# Patient Record
Sex: Female | Born: 1955 | Race: White | Hispanic: No | Marital: Married | Smoking: Never smoker
Health system: Southern US, Community
[De-identification: ages and names within clinical notes are randomized; demographics above are authoritative.]

## PROBLEM LIST (undated history)

## (undated) DIAGNOSIS — F419 Anxiety disorder, unspecified: Secondary | ICD-10-CM

## (undated) DIAGNOSIS — Z803 Family history of malignant neoplasm of breast: Secondary | ICD-10-CM

## (undated) DIAGNOSIS — Z8041 Family history of malignant neoplasm of ovary: Secondary | ICD-10-CM

## (undated) HISTORY — DX: Family history of malignant neoplasm of ovary: Z80.41

## (undated) HISTORY — PX: BACK SURGERY: SHX140

## (undated) HISTORY — DX: Anxiety disorder, unspecified: F41.9

## (undated) HISTORY — DX: Family history of malignant neoplasm of breast: Z80.3

---

## 1992-05-06 HISTORY — PX: VAGINAL HYSTERECTOMY: SUR661

## 1992-05-06 HISTORY — PX: CHOLECYSTECTOMY: SHX55

## 1993-05-06 HISTORY — PX: APPENDECTOMY: SHX54

## 2002-06-30 ENCOUNTER — Ambulatory Visit (HOSPITAL_COMMUNITY): Admission: RE | Admit: 2002-06-30 | Discharge: 2002-06-30 | Payer: Self-pay | Admitting: Internal Medicine

## 2002-06-30 ENCOUNTER — Encounter: Payer: Self-pay | Admitting: Internal Medicine

## 2002-09-12 HISTORY — PX: SPINE SURGERY: SHX786

## 2002-12-01 ENCOUNTER — Encounter (INDEPENDENT_AMBULATORY_CARE_PROVIDER_SITE_OTHER): Payer: Self-pay | Admitting: Specialist

## 2002-12-01 ENCOUNTER — Observation Stay (HOSPITAL_COMMUNITY): Admission: RE | Admit: 2002-12-01 | Discharge: 2002-12-02 | Payer: Self-pay | Admitting: Specialist

## 2002-12-02 ENCOUNTER — Encounter: Payer: Self-pay | Admitting: Specialist

## 2003-10-05 ENCOUNTER — Inpatient Hospital Stay (HOSPITAL_COMMUNITY): Admission: RE | Admit: 2003-10-05 | Discharge: 2003-10-08 | Payer: Self-pay | Admitting: Specialist

## 2004-12-18 ENCOUNTER — Encounter: Admission: RE | Admit: 2004-12-18 | Discharge: 2004-12-18 | Payer: Self-pay | Admitting: Gynecology

## 2005-01-21 ENCOUNTER — Other Ambulatory Visit: Admission: RE | Admit: 2005-01-21 | Discharge: 2005-01-21 | Payer: Self-pay | Admitting: Gynecology

## 2007-03-20 ENCOUNTER — Encounter: Admission: RE | Admit: 2007-03-20 | Discharge: 2007-03-20 | Payer: Self-pay | Admitting: Orthopedic Surgery

## 2007-05-13 ENCOUNTER — Inpatient Hospital Stay (HOSPITAL_COMMUNITY): Admission: RE | Admit: 2007-05-13 | Discharge: 2007-05-15 | Payer: Self-pay | Admitting: Specialist

## 2007-05-13 ENCOUNTER — Ambulatory Visit: Payer: Self-pay | Admitting: *Deleted

## 2007-09-08 ENCOUNTER — Encounter: Admission: RE | Admit: 2007-09-08 | Discharge: 2007-09-08 | Payer: Self-pay | Admitting: Gynecology

## 2009-03-08 ENCOUNTER — Encounter: Admission: RE | Admit: 2009-03-08 | Discharge: 2009-03-08 | Payer: Self-pay | Admitting: Gynecology

## 2010-03-06 LAB — HM COLONOSCOPY

## 2010-03-12 ENCOUNTER — Encounter: Admission: RE | Admit: 2010-03-12 | Discharge: 2010-03-12 | Payer: Self-pay | Admitting: Gynecology

## 2010-03-19 LAB — HM DEXA SCAN: HM Dexa Scan: NORMAL

## 2010-09-18 NOTE — Op Note (Signed)
NAMECECYLIA, BRAZILL NO.:  [id number]   MEDICAL RECORD NO.:  [id number]          PATIENT TYPE:  INP   LOCATION:  2550                         FACILITY:  [facility]   PHYSICIAN:  Jene Every, M.D.    DATE OF BIRTH:  12/04/1955   DATE OF PROCEDURE:  05/13/2007  DATE OF DISCHARGE:                               OPERATIVE REPORT   PREOPERATIVE DIAGNOSES:  Degenerative disc disease at L5-S1.   POSTOPERATIVE DIAGNOSES:  Degenerative disc disease at L5-S1.   PROCEDURES PERFORMED:  1. Anterior discectomy at L5-S1.  2. Anterior lumbar interbody fusion, utilizing a Synthes Synex      interbody cage.  3. Interbody fusion, utilizing Actifuse bone graft.   ANESTHESIA:  General.   ASSISTANT:  Alvy Beal, M.D.  Approach by Dr. Madilyn Fireman.   BRIEF HISTORY AND INDICATIONS:  This 55 year old is status post fusion  at 4-5 and discogenic pain at 5-1, disabling, confirmed with  discography.  She was indicated for an anterior interbody discectomy and  fusion.  Risks and benefits were discussed, including bleeding,  infection, no change in symptoms, worsening symptoms, pseudarthrosis,  need for revision, DVT, TE, anesthetic complications, etc.   TECHNIQUE:  Patient in supine position with a lumbar roll, the abdomen  was prepped and draped in the usual sterile fashion.  After 2 g Kefzol,  the abdomen was prepped and draped in the usual sterile fashion.  Dr.  Madilyn Fireman then performed the anterior approach to the L5-S1 disc space  anteriorly and please refer to his operative dictation.  After the  incision was made through the skin in transverse fashion on the left,  after fluoro localization with a radiopaque marker, the transverse  fascia was divided.  We opened the perineum, developed a plane between  that and the retroperitoneal space.  Following that, the vascular  structures, ureters and presacral vessels were then gently mobilized by  Dr. Madilyn Fireman.  A self-retaining Balfour  retractor was placed, exposing  quite nicely the anterior aspect of the L5-S1 disc space.  This was  confirmed by x-ray in the appropriate plane.   We used a trial spacer and used a #10 blade, incising along the end  plates of 5 and S1 and then, out as far laterally as the exposure.  The  disc was then removed with a Leksell rongeur and pituitaries.  We used a  small Cobb to detach the anterior disc from the vertebral end plate,  further aiding in the discectomy.  We performed sequential curetting of  the end plate from anterior to posterior, removing the disc that was  removed, preserving the end plates.  We used a contralateral distractor  and C-arm to judge our posterior distance.  Following the full  discectomy, anterolateral and mediolateral, we identified the uncinate  process of the vertebral body of 5 and the insertion of the annulus with  a small curved curet.  This annulus was then detached with the small  curved portion of the curet, placing over the top and behind the  uncinate process.  This was performed across the entire posterior  and  posterolateral aspect of the disc space to free the annulus.  Following  this, the disc space was copiously irrigated.   On exam of the end plates, there was good bleeding bone noted on the L5  and S1 vertebral bodies.  The vascular structures were well-protected.  Under fluoroscopy, we utilized a distractor and real-time C-arm,  distracted the 5-1 disc space and they distracted parallel, as opposed  to divergent, indicating that the detachment of the posterior annulus.  I then utilized trial spacers and we felt that the medium 12 degree  lordotic Synex device was the optimal device.  This was then selected  and packed with Actifuse bone graft.  The device was then placed in the  inserter, which is called the Squid, and under x-ray, we placed it on  the midline.  This was confirmed by sighting from the end of the table  to be midline and  vertical.  This was then impacted into place.  The  initial large insert was tried and this was unacceptable.  Following  impaction in the AP and lateral plane, it was found to be satisfactory  with the anterior portion and flush with the anterior portion of 5.  The  Squid was removed, the implant checked and was fixed well in the AP and  lateral plane.  The wound was irrigated.   Vessels were examined throughout the case.  We then placed the channel  guide for the screws and the awl on the anterior aspect of the disc.  The 5 and 1 on the right and then 5 and 1 on the left were placed after  broaching the cortex with an awl and insertion of 20 mm screws in 5 and  in 1 with excellent purchase and locking to the locking plate.  The  guide was then removed and inspected.  The implant was secured and was  flush with the anterior aspect of 5.  In the AP and lateral plane, it  was found to seat satisfactorily.  There was no involvement with the  pedicle screws from above.  This was felt to be an excellent placement  of the prosthesis.   The wound was copiously irrigated.  We inspected.  There was no evidence  of active bleeding.  We removed the Balfour retractors, copiously  irrigated the abdominal wound.  Dr. Madilyn Fireman then returned for closure.  He  closed the rectus sheath, the subcutaneous tissue and the skin and a  sterile dressing was applied.  She was then extubated without difficulty  and transported to the recovery room in satisfactory condition.   The patient tolerated the procedure well, no complications.   Approximate blood loss was 150 mL.   She had a pulse oximeter on the left leg throughout the case, which  demonstrated 100% saturation throughout the case.  In the recovery room,  patient had good pulses distally and good blood flow.   The patient tolerated the procedure well, no complications.      Jene Every, M.D.  Electronically Signed     JB/MEDQ  D:  05/13/2007   T:  05/13/2007  Job:  [id number]

## 2010-09-18 NOTE — Op Note (Signed)
NAMEASUZENA, WEIS NO.:  [id number]   MEDICAL RECORD NO.:  [id number]          PATIENT TYPE:  INP   LOCATION:  5039                         FACILITY:  [facility]   PHYSICIAN:  Balinda Quails, M.D.    DATE OF BIRTH:  01-22-1956   DATE OF PROCEDURE:  05/13/2007  DATE OF DISCHARGE:                               OPERATIVE REPORT   SURGEON:  Denman George, MD.   CO-SURGEON:  Jene Every, MD.   ASSISTANT:  Dahari D. Shon Baton, MD.   ANESTHETIC:  General endotracheal.   PREOPERATIVE DIAGNOSIS:  L5-S1 degenerative disk disease.   POSTOPERATIVE DIAGNOSIS:  L5-S1 degenerative disk disease.   PROCEDURE:  Left retroperitoneal anterior exposure L5-S1 for L5-S1 ALIF.   CLINICAL NOTE:  This is a 55 year old female with chronic back pain and  evidence of degenerative disk disease at L5-S1.  She was seen by Dr.  Shelle Iron and scheduled to undergo anterior lumbar interbody fusion at L5-  S1.  Patient seen preoperatively and the risks and benefits of the  operative procedure were explained in detail.  Potential risks of the  anterior retroperitoneal approach discussed included but not limited to  bleeding, infection, transfusion, limb ischemia, ureter injury, hernia,  DVT, pulmonary embolus and death.   OPERATIVE PROCEDURE:  The patient brought the operating room in stable  condition.  Placed in supine position.  General endotracheal anesthesia  induced.  Foley catheter, arterial line in place.  Central venous  catheter in place.  Pulse oximetry placed on the left foot.   The abdomen prepped and draped in a sterile fashion.  Transverse skin  incision made in the left lower quadrant from midline to lateral margin  of left rectus muscle.  Dissection carried down through the subcutaneous  tissue with electrocautery.  Left anterior rectus sheath incised from  midline to lateral margin.  The rectus muscle mobilized.  The rectum the  left rectus muscle retracted medially.  The  retroperitoneal space  entered.  The peritoneal contents rotated anteriorly.  The peritoneum  bluntly pushed off of the poster rectus sheath which was incised  longitudinally.  The psoas muscle and genitofemoral nerves were  identified and preserved.  The soft tissues were then pushed off of the  left common and external iliac artery.  The ureter was retracted  medially with the abdominal contents.  The L5-S1 disk was palpated.  This was cleared of presacral tissues.  The fascial plane overlying the  L5-S1 disk was incised.  The presacral tissues were then bluntly pushed  off the L5-S1 disk from left-to-right.  Middle sacral vessel was clipped  and divided.  The L5-S1 disk fully exposed.   The Thompson-Brau retractor was then assembled.  Using reverse lip Brau  blades the L5-S1 disk was exposed, placing the reverse lip blades on the  lateral margins of the L5 and S1 bodies bilaterally.  Malleable  retractors were used to retract superiorly and inferiorly.   Dr. Shelle Iron with the assistance of Dr. Shon Baton then completed an L5-S1  ALIF.   At completion of the ALIF, retractors were removed.  There was no  significant bleeding.  No apparent complications.  Ureter replaced in  its anatomical position.  Sponge and instrument counts correct.   The anterior rectus sheath then closed with running 0 Vicryl suture.  Subcutaneous tissue closed with running 2-0 Vicryl suture.  Superficial  subcutaneous layer closed running 2-0 Vicryl suture.  Skin closed with 4-  0 Monocryl.  Dermabond applied.  Sterile dressing applied.   The patient tolerated procedure well.  No apparent complications.  Transferred to the recovery room in stable condition.      Balinda Quails, M.D.  Electronically Signed     PGH/MEDQ  D:  05/15/2007  T:  05/15/2007  Job:  [id number]

## 2010-09-21 NOTE — H&P (Signed)
NAME:  Kelli, Gould                         ACCOUNT NO.:  [id number]   MEDICAL RECORD NO.:  [id number]                   PATIENT TYPE:  INP   LOCATION:  NA                                   FACILITY:  [facility]   PHYSICIAN:  Jene Every, M.D.                 DATE OF BIRTH:  01-16-1956   DATE OF ADMISSION:  10/05/2003  DATE OF DISCHARGE:                                HISTORY & PHYSICAL   CHIEF COMPLAINT:  Left lower extremity pain.   HISTORY:  Kelli Gould is a 55 year old female who initially presented to [place]  in June of 2004 with complaints of low back and lower extremity pain.  The  patient was initially treated conservatively, however, continued to be  symptomatic.  MRI was obtained which did show degenerative disk disease, L4-  5 level, with disk bulging.  Patient underwent lateral recess decompression,  microdiskectomy at the L4-5 level.  She did well for some time, however,  then noted reoccurrence of her symptoms.  Re-imaging was done in April of  2005, which just showed recurrent foraminal disk protrusion on the left,  facing the L4 nerve root, as well as enhancing epidural fibrosis around the  thecal sac, around the L5 nerve root.  Due to the fact the patient has  failed to progress with the conservative and the evidence of recurrent disk  herniation as well as progression of her degenerative disk disease, it is  felt she will benefit from a decompression along with a TLIF and  posterolateral fusion.  The risks and benefits of the surgery were discussed  with the patient and she wishes to proceed.   MEDICAL HISTORY:  Noncontributory.   CURRENT MEDICATIONS:  Current medications include:  1. Celebrex 1 p.o. daily.  2. Vicodin p.r.n.  3. Nortriptyline 10 mg 1 p.o. daily.   ALLERGIES:  None.   PREVIOUS SURGERY:  Previous surgeries include:  1. Cholecystectomy.  2. Hysterectomy.  3. Decompression in 2004.   SOCIAL HISTORY:  The patient is married.  She does not smoke  or drink  alcohol.  She has 2 children, lives in a 1-story home.  Husband will be care-  giver following the surgery.   FAMILY HISTORY:  Mother has a history of hypertension.   REVIEW OF SYSTEMS:  GENERAL:  The patient denies any fever, chills, night  sweats or bleeding tendencies.  CNS:  No blurred or double vision, seizure,  headache or paralysis.  RESPIRATORY:  No shortness of breath, productive  cough or hemoptysis.  CARDIOVASCULAR:  No chest pain, angina or orthopnea.  GU:  No dysuria, hematuria or discharge.  GI:  No nausea, vomiting,  diarrhea, constipation, melena or bloody stools.  MUSCULOSKELETAL:  Musculoskeletal as pertinent in HPI.   PHYSICAL EXAM:  VITAL SIGNS:  On physical exam, pulse is 100, respiratory  rate is 16, BP 160/110.  GENERAL:  This is a well-developed,  well-nourished 55 year old female in  mild distress.  HEENT:  Atraumatic, normocephalic.  Pupils equal, round and reactive to  light.  EOMs intact.  NECK:  Neck supple with no lymphadenopathy.  CHEST:  Chest clear to auscultation bilaterally.  No rhonchi, wheezes or  rales.  BREASTS AND GU:  Not examined, not pertinent to HPI.  HEART:  Tachycardia is noted, regular rhythm; no murmurs, gallops or rubs  are noted.  ABDOMEN:  Abdomen is soft, nontender and nondistended.  Bowel sounds x4.  EXTREMITIES:  The patient has a positive straight leg raise on the left that  produces low back and buttock pain.  EHL is 5-/5.  BACK:  She does have significant pain with range of motion of the lumbar  spine.   IMPRESSION:  Progressive degenerative disk disease, L4-5, with recurrent  herniated nucleus pulposus.   PLAN:  Decompression at the L4-5 level with TLIF, posterolateral fusion with  instrumentation using autologous allograft bone.      Kelli Gould, P.A.                   Jene Every, M.D.    CS/MEDQ  D:  09/22/2003  T:  09/23/2003  Job:  [id number]

## 2010-09-21 NOTE — Op Note (Signed)
NAME:  Kelli Gould, Kelli Gould                         ACCOUNT NO.:  [id number]   MEDICAL RECORD NO.:  [id number]                   PATIENT TYPE:  INP   LOCATION:  2550                                 FACILITY:  [facility]   PHYSICIAN:  Jene Every, M.D.                 DATE OF BIRTH:  Oct 03, 1955   DATE OF PROCEDURE:  10/05/2003  DATE OF DISCHARGE:                                 OPERATIVE REPORT   PREOPERATIVE DIAGNOSIS:  Recurrent disk herniation, degenerative disk  disease, L4-5.   POSTOPERATIVE DIAGNOSIS:  Recurrent disk herniation, degenerative disk  disease, L4-5.   OPERATION PERFORMED:  1. Redo decompression, L4-5, foraminotomies of L4 and L5.  2. Transforaminal interbody fusion at L4-5 utilizing a [employer] PEAK     interbody device with autologous and allograft bone graft utilizing the     Symphony bone graft.  3. Pathfinder pedicle screw instrumentation, L4-5.  4. Posterolateral fusion utilizing autologous and symphony bone graft.  5. Intraoperative continuous neural monitoring four hours with EMG pedicle     screw testing.   SURGEON:  Jene Every, M.D.   ASSISTANT:  1. Kerrin Champagne, M.D.  2. Roma Schanz, P.A.   ANESTHESIA:  General.   INDICATIONS FOR PROCEDURE:  The patient is a 55 year old with recurrent disk  herniation with progressive disk degeneration, disabling back pain, left leg  pain.  Operative intervention was indicated for redo decompression,  interbody distraction for the L4 and L5 foramina and fusion.  Risks and  benefits discussed including bleeding, infection __________  CSF leakage,  epidural fibrosis, adjacent segment disease, need for fusion in the future  in the adjacent segments, hardware failure, etc.   DESCRIPTION OF PROCEDURE:  The patient was placed in supine position after  adequate induction of general anesthesia, 1 g Kefzol, she was placed prone  on the Wilson frame in flexion.  All bony prominences were well padded.  The  lumbar  region was prepped and draped in the usual sterile fashion.  Incision  was made from the spinous process of 3 to S1.  Subcutaneous tissue was  dissected with electrocautery utilized to achieve hemostasis.  Dorsolumbar  fascia was identified and divided in line with the skin incision.  Kocher  was placed on the spinous process of 4.  This was confirmed with a C-arm.  Next, elevated the paraspinous musculature from lamina of 4 and 5  bilaterally.  Then removed portion of the spinous process of 4 and of 5  after skeletonizing it and removing the interspinous ligament, utilizing the  cancellous bone for future bone graft.  I then skeletonized the lamina of 4  and 5 using 2 and 3 mm Kerrison to enlarge the decompression of 4 and 5.  Following this, I used a high speed bur and an osteotome to remove the  inferior process of 4.  I then used an osteotome to remove the superior  process of 5.  I protected the 4 and the 5 roots at all times.  Then used a  2 and a 3 mm Kerrison to remove the residual facet exposing the pedicle at  5.  The 4 root was well protected in fat and ligamentum flavum.  Recurrent  disk herniation was noted.  I mobilized the 5 root, performed a foraminotomy  L5 and gently mobilized it medially.  The neural elements were well  protected throughout the case.  Intraoperative neural monitoring was  utilized.  Annulotomy was performed at 4-5 and a copious portion of disk  material was removed from the disk space.  The disk space was then curetted.  The end plates were curetted and a full diskectomy and skeletonization of  the end plates occurred and removed all soft tissue.  This was then  sequentially dilated to 11 mm with excellent purchase in the lateral plane  under x-ray and it was felt that a 10 interbody spacer would be optimal.  Then used a lamina spreader between the spinous processes to gain access to  the space.  With the neural elements well protected, electrocautery  then  utilized to achieve hemostasis.  I then utilized the 10 PEAK cage packed  with autologous bone graft from the facet and spinous processes.  I put the  autologous bone graft into the disk space in front of the cage, packed it  copiously.  I then inserted the cage and kicked it to the midline utilizing  the appropriate insertion devices with excellent distraction of the disk  space.  I then removed the lamina spreader.  In the AP and lateral plane it  was found to be satisfactory with good distraction noted. I then placed a  hockey stick probe in the foramina of 4 and 5, found to be widely patent.  I  utilized neural monitoring and tested the nerve roots of 4 and 5.  It showed  1 milliamp which was appropriate.  Bipolar electrocautery was utilized to  achieve strict hemostasis. Copiously irrigated with irrigation.  I then  placed Thrombin soaked Gelfoam over the defect.  Then we closed the central  fascia with towel clips, made separate fascial incision, paraspinous  musculature, insertion of the pedicle screws.  I used a Pathfinder technique  and used a Jamshidi needle and in the appropriate convergence in the AP  plane in the lateral aspect of the pedicle bisecting the transverse process,  I entered the pedicle of 4 and 5 in a similar fashion in the AP and lateral  plane utilizing the guidewire meticulously paying attention to the guidewire  placement avoiding any advancement and broaching of the cortex of the  vertebrae.  We tested these with the neural monitoring and they were found  to be satisfactory.  We then dilated, used an awl then a tap at both 4 and 5  on the left.  We left the guidewires in and then placed the screws on the  contralateral side.  In the AP and lateral plane they were found to be  satisfactory.  We placed a hockey stick probe, felt the pedicle at 4 and 5  and there was no broach in the pedicle noted.  In a similar fashion on the contralateral side, we entered  the pedicles with a guidewire and Jamshidi  needle followed by dilator, awl, tap.  On the contralateral side we inserted  6.5 40 mm screws with excellent purchase on the right and then on  the left,  we did the same.  In the AP and lateral plane they were found to be  satisfactory, converging in the appropriate version.  Excellent placement of  these occurred and excellent purchase was noted.  Placed a 14 mm curved rod  bilaterally with provisional tightening, compressed both sides, locked them  down and removed the end caps.  In the AP and lateral plane, there was  satisfactory excellent placement of the rods convergence.  Prior to final  tightening, we reversed the lordosis on the Wilson frame.  Wound copiously  irrigated and then inspected the decompression area.  Hockey stick probe  placed in the foramina of 4 and 5, found widely patent, no broaching of the  pedicle, no active bleeding.  Copiously irrigated.  Thrombin soaked Gelfoam  was placed in the lateral recess.  Next, on the right we decorticated the  transverse processes and utilized the Symphony bone graft into the lateral  recess for posterolateral fusion.  This was packed copiously.  Next, the  fascial incision was closed with #1 Vicryl figure-of-eight sutures.  Hemovac  was placed centrally and brought out through a lateral stab wound in the  skin.  There was no evidence of CSF leak or intractable bleeding.  Dorsolumbar fascia reapproximated with #1 Vicryl interrupted figure-of-eight  sutures.  Subcutaneous tissue reapproximated with #2 Vicryl simple sutures.  The skin was reapproximated with staples.  The wound was dressed sterilely  and she was placed supine on the hospital bed and extubated without  difficulty and transported to recovery room in satisfactory condition.  The  patient tolerated the procedure well.  No complications.  Blood loss 300 mL.  There was no significant neural activity noted on the continuous neural   monitoring for four hours.  All pedicle screws that were placed were shown  satisfactory by probe testing.                                               Jene Every, M.D.    [name]  D:  10/05/2003  T:  10/05/2003  Job:  [id number]

## 2010-09-21 NOTE — Discharge Summary (Signed)
NAME:  Kelli Gould, Kelli Gould                         ACCOUNT NO.:  [id number]   MEDICAL RECORD NO.:  [id number]                   PATIENT TYPE:  INP   LOCATION:  5024                                 FACILITY:  [facility]   PHYSICIAN:  Jene Every, M.D.                 DATE OF BIRTH:  July 04, 1955   DATE OF ADMISSION:  10/05/2003  DATE OF DISCHARGE:  10/08/2003                                 DISCHARGE SUMMARY   ADMISSION DIAGNOSES:  Progressive degenerative disk disease L4-5 with  recurrent disk herniation.   DISCHARGE DIAGNOSES:  1. Progressive degenerative disk disease L4-5 with recurrent disk     herniation.  2. Status post redo decompression L4-5 with PLIF posterolateral fusion with     instrumentation using allograft and autograft bone.   HISTORY:  Kelli Gould is a 55 year old female who presented with complaints  of low back and lower extremity pain in June 2004.  The patient was treated  conservatively initially.  MRI was obtained which showed degenerative disk  disease at L4-5 level with disk bulging.  The patient underwent  decompression and microdiskectomy at this level.  She did well for several  months.  However, she noted gradual reoccurrence of her symptoms.  Repeat  MRI done in April 2005 showed recurrent foraminal disk protrusion on the  left facing L4 nerve root as well as epidural fibrosis around the thecal  sack and around the L5 nerve root.  Due to the patient's failure to progress  with conservative treatment and the evidence of recurrent disk herniation  and progression of her degenerative disk disease, it was felt she would  benefit from a redo decompression with a PLIF and posterolateral fusion.  The risks and benefits of the surgery were discussed with the patient.  She  wished to proceed.   PROCEDURES:  The patient was taken to the OR on October 05, 2003, to undergo a  redo decompression at the L4-5 level, transforaminal interbody fusion L4-5,  posterolateral fusion  using pedicle screw instrumentation.  Surgeon: Jene Every, M.D.  Assistant: Roma Schanz, P.A.-C and Kerrin Champagne, M.D.  Anesthesia:  General.  Complications:  None.   CONSULTATIONS:  PT and OT.   LABORATORY AND X-RAY DATA:  Preoperative labs:  CBC showed a white blood  cell count of 11.6, hemoglobin 14.6, hematocrit 41.7.  Routine CBCs were  followed throughout the hospital course.  The patient's white blood cell  count did go up to 18.1; however, at the time of discharge, it had gone down  to 14.9.  Routine hemoglobin and hematocrit showed slight drop in hemoglobin  to 10.8, hematocrit 31.1.  At the time of discharge, hemoglobin was 11.1,  hematocrit 31.5.  Routine chemistries done preoperatively were within normal  range.  The patient did have a slight drop in her potassium level to 3.4.  Sodium 136, slight elevation in glucose to maximum of 125.  Blood type A  negative.   EKG shows normal sinus rhythm.   Do not see a preoperative chest x-ray.   HOSPITAL COURSE:  The patient was admitted and taken to the OR for the above-  stated procedure.  She was then transferred to the PACU and then to the  orthopedic floor for continued postoperative care.  Routine postoperative  orders were followed and included IV antibiotics.  One Hemovac drain was  placed at the time of surgery.  The patient was placed on PCA analgesics.  Foley was placed.  The patient did very well postoperatively.  She advanced  well with her therapy.   On postoperative day #1, her diet was slowly advanced.  She did have a  slight temperature on postoperative day #2 with a maximum of 101.2 with  pulse 106.  Dressing was clean and dry.  Incision was clean and dry without  evidence of infection.  Hemovac was discontinue.  Motor and neurovascular  functions remained intact.  IV was discontinued.  Diet was advanced.  Incentive spirometer was encouraged.   On postoperative day #3, the patient was doing well.   She was afebrile. She  requested to be discharged home.  At this point, it was felt she was stable  enough to be discharged to home with all home health needs met, PT, OT.   DISCHARGE MEDICATIONS:  1. Percocet 1 to 2 p.o. q.4-6h. as needed.  2. Colace 100 mg 1 p.o. b.i.d.  3. Robaxin 500 mg 1 p.o. q.8h. p.r.n. spasm.   ACTIVITY:  The patient is to walk as tolerated.  She should use her back  brace when ambulating, back precautions initially.  No bending, twisting,  lifting, or arching.   WOUND CARE:  She is to change her dressing daily.  Okay to shower.   FOLLOW UP:  She is to call the office for any increased fever, excess  drainage.  She is to make an appointment with Dr. Shelle Iron in approximately 10  to 14 days.   DIET:  As tolerated.   CONDITION ON DISCHARGE:  Stable.   FINAL DIAGNOSES:  Status post redo decompression at L4-5 with posterolateral  fusion and PLIF.      Roma Schanz, P.A.                   Jene Every, M.D.    CS/MEDQ  D:  11/11/2003  T:  11/12/2003  Job:  [id number]

## 2010-09-21 NOTE — Op Note (Signed)
NAME:  Kelli Gould, Kelli Gould                         ACCOUNT NO.:  [id number]   MEDICAL RECORD NO.:  [id number]                   PATIENT TYPE:  AMB   LOCATION:  DAY                                  FACILITY:  [facility]   PHYSICIAN:  Jene Every, M.D.                 DATE OF BIRTH:  [birth year]   DATE OF PROCEDURE:  12/01/2002  DATE OF DISCHARGE:                                 OPERATIVE REPORT   PREOPERATIVE DIAGNOSES:  1. Spinal stenosis.  2. Degenerative disk disease.  3. 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   POSTOPERATIVE DIAGNOSES:  1. Spinal stenosis.  2. Degenerative disk disease.  3. 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   PROCEDURE PERFORMED:  1. Lateral recess decompression, L4-5 left, prominent.  2. Foraminotomy L4 and L5.  3. Microdiskectomy, L4-5 left.   ANESTHESIA:  General.   SURGEON:  Javier Docker, M.D.   ASSISTANT:  Roma Schanz, PA   BRIEF HISTORY AND INDICATIONS:  This is a 55 year old, who developed  refractory left lower extremity radicular pain in the L5 nerve root  distribution.  MRI indicated a small pericentral disk protrusion at L4-5,  hypertrophied 4-5 facet and lateral recess stenosis.  There was an area of  foraminal narrowing at L4 as well and a small disk herniation.  The  patient's symptoms progressed, exclusively in the L5 nerve root  distribution.  She had temporary relief for an epidural steroid injection at  L4-5.  Due to her refractory symptoms at 4-5 with predominant leg pain as  opposed to back pain, she was indicated for decompression of the L5 nerve  root by microdiskectomy and lateral recess decompression.  Preoperatively,  her exam had worsened since previously evaluated.  She had a positive  straight leg raise at 30 degrees for radicular pain into the top of the  foot.  EHL weakness was 5-/5 an decreased sensation in the L5 dermatome.  The risks and benefits discussed including bleeding, infection, damage to  vascular  structures, CSF leakage, epidural fibrosis, worsening symptoms,  need for fusion in the future, etc.   TECHNIQUE:  The patient in supine position.  After the induction of adequate  general anesthesia and 1 g of Kefzol, she was placed prone on the [place]  frame.  All bony prominences well-padded.  Lumbar region is prepped and  draped in the usual sterile fashion.  Two 18 gauge spinal needles are  utilized to localize the L4-5 interspace and confirmed with x-ray.  An  incision was made from the spinous process of L4 to L5.  Subcutaneous tissue  was dissected.  Electrocautery was utilized to achieve hemostasis.  Initially, we used a new concept minimally invasive endoscopic retractor.  This, however, did not gain [place] adequate medial visualization to evaluate the  interlaminar space and to gain access to the foramen.  Therefore, we  replaced it with a McCullough retractor with excellent visualization.  Obtained multiple x-rays to confirm the 4-5 space.  She had a hypertrophied  facet on the left.  I performed the hemi-laminotomy of the caudad edge of L4  utilizing a 2 and a 3 mm Kerrison.  Detached the ligamentum flavum from the  cephalad edge of L5 utilizing a straight curette.  A neural patty was placed  beneath the ligamentum flavum to protect the neural elements.  The  ligamentum flavum was removed from the interlaminar space.  There was severe  lateral recess stenosis noted here, mainly secondary to bony hypertrophy.  We utilized therefore a high-speed bur to gain access laterally and then  performed a partial medial hemifacetectomy of just the medial one-third  which was further enlarged with a 2 and a 3 mm Kerrison.  The L5 root, which  had been found to be compressing the lateral recess, was gently mobilized  medially and a focal HNP with a small extrusion was noted at 4-5.  After  performing an L5 foraminotomy, I performed an annulotomy at L4-5 and  performed a diskectomy and removed  the extruded fragment.  I also mobilized  the disk herniation which extended also into the L4 foramen with a nerve  hook and removed it from underneath the facet.  Hockey-stick probe placed in  the foramen in L4 and L5 after the decompression found them to be widely  patent and undercut the 4-5 facet the 2 mm Kerrison as well.  There was an  extensive epidural venous plexus noted as well, and electrocautery was  utilized to achieve hemostasis and divide the plexus, mobilizing the nerve  root.  Where the nerve was tethered prior to the decompression, after the  decompression, she had a medial excursion of at least 1 cm without residual  compression.  The L5 nerve root was found to be erythematous and edematous.  We sent the disk for specimen.  Copiously irrigated the disk space with  antibiotic irrigation and the interlaminar space with antibiotic irrigation.  Bone wax was placed over the bone surface of the partial facetectomies.  No  evidence of active bleeding or CSF leakage.  Removed the retractor,  copiously irrigated with antibiotic irrigation again.  Dorsolumbar fascia  reapproximated with #1 Vicryl interrupted figure-of-eight sutures,  subcutaneous tissue reapproximated with 2-0 Vicryl simple sutures.  Skin was  reapproximated with 4-0 subcuticular Prolene.  Wound was reinforced with  Steri-Strips.  Sterile dressing was applied.  She was placed supine on the  hospital bed, extubated without difficulty, and transported to the recovery  room in satisfactory condition.   The patient tolerated the procedure well with no complications.                                               Jene Every, M.D.    [name]  D:  12/01/2002  T:  12/01/2002  Job:  [id number]

## 2010-09-21 NOTE — H&P (Signed)
NAMENIOMI, VALENT NO.:  [id number]   MEDICAL RECORD NO.:  [id number]                   PATIENT TYPE:   LOCATION:                                       FACILITY:   PHYSICIAN:  Jene Every, M.D.                 DATE OF BIRTH:   DATE OF ADMISSION:  12/01/2002  DATE OF DISCHARGE:                                HISTORY & PHYSICAL   CHIEF COMPLAINT:  Low back pain with radicular pain into the left lower  extremity.   HISTORY:  [name] is a 55 year old female who noted a month onset of  low back pain. The patient states that the pain began when she missed a step  back in March of 2004; however, has progressed from low back pain to pain  down the left buttocks into the left foot and great toe. The patient does  note weakness towards the end of the day, increased pain with sitting. The  patient was initially treated conservatively following an MRI that showed  disk degeneration at L4-5 with a disk protrusion on the left at L4-5 with  lateral recess stenosis. She was treated with ESI at L4-5; however, she  continued with significant pain. She got no relief from the [facility] or [facility].  On physical exam, straight leg raise produces left buttock pain. There is  some mild EHL weakness on the left as compared to the right. It is felt at  this point due to the fact the patient has failed conservative treatment  including ESI, analgesics as well as anti-inflammatories that she would  benefit from a lumbar decompression. The risks and benefits of this surgery  were discussed with the patient and she wishes to proceed.   PAST MEDICAL HISTORY:  Significant for migraines.   PAST SURGICAL HISTORY:  Cholecystectomy, hysterectomy.   CURRENT MEDICATIONS:  Current medications included p.r.n. Celebrex, Vicodin,  and Robaxin. No known drug allergies.   SOCIAL HISTORY:  The patient is married. She denies any tobacco or alcohol  intake. Her husband will be her  caregiver following surgery.   FAMILY HISTORY:  Mother has hypertension.   REVIEW OF SYMPTOMS:  GENERAL:  The patient denies any fever, chills, night  sweats or bleeding tendencies. CNS:  No blurred or double vision, seizure,  headache or paralysis. RESPIRATORY:  No shortness of breath, productive  cough or hemoptysis. CARDIOVASCULAR:  No chest pain, angina, orthopnea. GU:  No dysuria, hematuria or discharge. GI:  No nausea, vomiting, constipation,  melena or bloody stools. MUSCULOSKELETAL:  As pertinent to HPI.   PHYSICAL EXAMINATION:  VITAL SIGNS:  Pulses 88, respirations 18, blood  pressure 130/90.  GENERAL:  This is a well-developed, well-nourished, 55 year old female in  mild distress. She does walk with a slight antalgic gait.  HEENT:  Normocephalic, atraumatic. Pupils equal round and reactive to light.  EOMs intact.  NECK:  Supple, no  lymphadenopathy.  CHEST:  Clear to auscultation bilaterally. No rhonchi, wheezes or rales.  BREAST/GU:  Not examined not pertinent to HPI.  ABDOMEN:  Soft, nontender, nondistended, bowel sounds x4.  HEART:  Regular rate and rhythm without murmur, gallop or rub.  SKIN:  No rashes or lesions are noted.  EXTREMITIES:  The patient does have positive straight leg raise on left that  produces some mild buttock pain. She has some mild EHL weakness on the left  as compared to the right.   X-rays show disk degeneration at L4-5, with paracentral disk protrusion on  the left at L4-5 with lateral recess stenosis.   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   PLAN:  The patient will be admitted to [facility] to undergo a  decompression at L4-5 and microdiskectomy by Jene Every, M.D. on December 01, 2002.     Roma Schanz, P.A.                   Jene Every, M.D.    CS/MEDQ  D:  11/25/2002  T:  11/25/2002  Job:  [id number]

## 2011-01-23 LAB — CBC
HCT: 34.3 — ABNORMAL LOW
HCT: 41.4
Hemoglobin: 14
MCHC: 33.8
MCHC: 34.2
MCV: 95.4
Platelets: 305
Platelets: 335
Platelets: 356
RBC: 4.33
RDW: 13.1
WBC: 14.5 — ABNORMAL HIGH

## 2011-01-23 LAB — URINALYSIS, ROUTINE W REFLEX MICROSCOPIC
Glucose, UA: NEGATIVE
Hgb urine dipstick: NEGATIVE
Urobilinogen, UA: 0.2
pH: 5.5

## 2011-01-23 LAB — TYPE AND SCREEN
ABO/RH(D): O NEG
Antibody Screen: NEGATIVE

## 2011-01-23 LAB — POCT I-STAT 7, (LYTES, BLD GAS, ICA,H+H)
Bicarbonate: 23.6
Calcium, Ion: 1.17
HCT: 34 — ABNORMAL LOW
Hemoglobin: 11.6 — ABNORMAL LOW
O2 Saturation: 100
Sodium: 138
pCO2 arterial: 34 — ABNORMAL LOW
pO2, Arterial: 260 — ABNORMAL HIGH

## 2011-01-23 LAB — BASIC METABOLIC PANEL
BUN: 2 — ABNORMAL LOW
Chloride: 103
Creatinine, Ser: 0.6
GFR calc Af Amer: >60
GFR calc Af Amer: >60
GFR calc non Af Amer: >60
Glucose, Bld: 112 — ABNORMAL HIGH
Potassium: 4.1
Sodium: 136
Sodium: 138

## 2011-01-23 LAB — PROTIME-INR: Prothrombin Time: 12.4

## 2011-01-23 LAB — APTT: aPTT: 31

## 2011-03-04 ENCOUNTER — Other Ambulatory Visit: Payer: Self-pay | Admitting: Gynecology

## 2011-03-04 DIAGNOSIS — Z1231 Encounter for screening mammogram for malignant neoplasm of breast: Secondary | ICD-10-CM

## 2011-03-07 LAB — HM PAP SMEAR

## 2011-03-20 ENCOUNTER — Other Ambulatory Visit: Payer: Self-pay | Admitting: Gynecology

## 2011-03-20 ENCOUNTER — Ambulatory Visit
Admission: RE | Admit: 2011-03-20 | Discharge: 2011-03-20 | Disposition: A | Discharge status: Home / Self Care | Payer: Managed Care, Other (non HMO) | Source: Ambulatory Visit | Attending: Gynecology | Admitting: Gynecology

## 2011-03-20 DIAGNOSIS — Z1231 Encounter for screening mammogram for malignant neoplasm of breast: Secondary | ICD-10-CM

## 2011-07-18 ENCOUNTER — Ambulatory Visit (INDEPENDENT_AMBULATORY_CARE_PROVIDER_SITE_OTHER): Payer: Managed Care, Other (non HMO) | Admitting: Family Medicine

## 2011-07-18 ENCOUNTER — Encounter: Payer: Self-pay | Admitting: Family Medicine

## 2011-07-18 VITALS — BP 126/82 | HR 81 | Temp 97.9°F | Ht 63.5 in | Wt 207.6 lb

## 2011-07-18 DIAGNOSIS — Z0000 Encounter for general adult medical examination without abnormal findings: Secondary | ICD-10-CM

## 2011-07-18 DIAGNOSIS — G8929 Other chronic pain: Secondary | ICD-10-CM

## 2011-07-18 DIAGNOSIS — F419 Anxiety disorder, unspecified: Secondary | ICD-10-CM | POA: Insufficient documentation

## 2011-07-18 DIAGNOSIS — F411 Generalized anxiety disorder: Secondary | ICD-10-CM

## 2011-07-18 DIAGNOSIS — Z23 Encounter for immunization: Secondary | ICD-10-CM

## 2011-07-18 DIAGNOSIS — M549 Dorsalgia, unspecified: Secondary | ICD-10-CM

## 2011-07-18 LAB — LDL CHOLESTEROL, DIRECT: Direct LDL: 144.7 mg/dL

## 2011-07-18 LAB — CBC WITH DIFFERENTIAL/PLATELET
Basophils Relative: 0.4 % (ref 0.0–3.0)
Eosinophils Relative: 0.3 % (ref 0.0–5.0)
HCT: 41.4 % (ref 36.0–46.0)
Lymphs Abs: 1.9 10*3/uL (ref 0.7–4.0)
Monocytes Relative: 4.9 % (ref 3.0–12.0)
Neutrophils Relative %: 76.1 % (ref 43.0–77.0)
Platelets: 384 10*3/uL (ref 150.0–400.0)
RBC: 4.33 Mil/uL (ref 3.87–5.11)
WBC: 10.5 10*3/uL (ref 4.5–10.5)

## 2011-07-18 LAB — POCT URINALYSIS DIPSTICK
Bilirubin, UA: NEGATIVE
Glucose, UA: NEGATIVE
Leukocytes, UA: NEGATIVE
Nitrite, UA: NEGATIVE

## 2011-07-18 LAB — LIPID PANEL: Total CHOL/HDL Ratio: 5

## 2011-07-18 LAB — HEPATIC FUNCTION PANEL
ALT: 19 U/L (ref 0–35)
AST: 26 U/L (ref 0–37)
Bilirubin, Direct: 0 mg/dL (ref 0.0–0.3)
Total Bilirubin: 0.1 mg/dL — ABNORMAL LOW (ref 0.3–1.2)

## 2011-07-18 LAB — BASIC METABOLIC PANEL
BUN: 11 mg/dL (ref 6–23)
GFR: 87.89 mL/min (ref >60.00)
Potassium: 3.5 mEq/L (ref 3.5–5.1)

## 2011-07-18 LAB — TSH: TSH: 1 u[IU]/mL (ref 0.35–5.50)

## 2011-07-18 NOTE — Patient Instructions (Signed)
Preventive Care for Adults, Female A healthy lifestyle and preventive care can promote health and wellness. Preventive health guidelines for women include the following key practices.  A routine yearly physical is a good way to check with your caregiver about your health and preventive screening. It is a chance to share any concerns and updates on your health, and to receive a thorough exam.   Visit your dentist for a routine exam and preventive care every 6 months. Brush your teeth twice a day and floss once a day. Good oral hygiene prevents tooth decay and gum disease.   The frequency of eye exams is based on your age, health, family medical history, use of contact lenses, and other factors. Follow your caregiver's recommendations for frequency of eye exams.   Eat a healthy diet. Foods like vegetables, fruits, whole grains, low-fat dairy products, and lean protein foods contain the nutrients you need without too many calories. Decrease your intake of foods high in solid fats, added sugars, and salt. Eat the right amount of calories for you.Get information about a proper diet from your caregiver, if necessary.   Regular physical exercise is one of the most important things you can do for your health. Most adults should get at least 150 minutes of moderate-intensity exercise (any activity that increases your heart rate and causes you to sweat) each week. In addition, most adults need muscle-strengthening exercises on 2 or more days a week.   Maintain a healthy weight. The body mass index (BMI) is a screening tool to identify possible weight problems. It provides an estimate of body fat based on height and weight. Your caregiver can help determine your BMI, and can help you achieve or maintain a healthy weight.For adults 20 years and older:   A BMI below 18.5 is considered underweight.   A BMI of 18.5 to 24.9 is normal.   A BMI of 25 to 29.9 is considered overweight.   A BMI of 30 and above is  considered obese.   Maintain normal blood lipids and cholesterol levels by exercising and minimizing your intake of saturated fat. Eat a balanced diet with plenty of fruit and vegetables. Blood tests for lipids and cholesterol should begin at age 20 and be repeated every 5 years. If your lipid or cholesterol levels are high, you are over 50, or you are at high risk for heart disease, you may need your cholesterol levels checked more frequently.Ongoing high lipid and cholesterol levels should be treated with medicines if diet and exercise are not effective.   If you smoke, find out from your caregiver how to quit. If you do not use tobacco, do not start.   If you are pregnant, do not drink alcohol. If you are breastfeeding, be very cautious about drinking alcohol. If you are not pregnant and choose to drink alcohol, do not exceed 1 drink per day. One drink is considered to be 12 ounces (355 mL) of beer, 5 ounces (148 mL) of wine, or 1.5 ounces (44 mL) of liquor.   Avoid use of street drugs. Do not share needles with anyone. Ask for help if you need support or instructions about stopping the use of drugs.   High blood pressure causes heart disease and increases the risk of stroke. Your blood pressure should be checked at least every 1 to 2 years. Ongoing high blood pressure should be treated with medicines if weight loss and exercise are not effective.   If you are 55 to 56   years old, ask your caregiver if you should take aspirin to prevent strokes.   Diabetes screening involves taking a blood sample to check your fasting blood sugar level. This should be done once every 3 years, after age 45, if you are within normal weight and without risk factors for diabetes. Testing should be considered at a younger age or be carried out more frequently if you are overweight and have at least 1 risk factor for diabetes.   Breast cancer screening is essential preventive care for women. You should practice "breast  self-awareness." This means understanding the normal appearance and feel of your breasts and may include breast self-examination. Any changes detected, no matter how small, should be reported to a caregiver. Women in their 20s and 30s should have a clinical breast exam (CBE) by a caregiver as part of a regular health exam every 1 to 3 years. After age 40, women should have a CBE every year. Starting at age 40, women should consider having a mammography (breast X-ray test) every year. Women who have a family history of breast cancer should talk to their caregiver about genetic screening. Women at a high risk of breast cancer should talk to their caregivers about having magnetic resonance imaging (MRI) and a mammography every year.   The Pap test is a screening test for cervical cancer. A Pap test can show cell changes on the cervix that might become cervical cancer if left untreated. A Pap test is a procedure in which cells are obtained and examined from the lower end of the uterus (cervix).   Women should have a Pap test starting at age 21.   Between ages 21 and 29, Pap tests should be repeated every 2 years.   Beginning at age 30, you should have a Pap test every 3 years as long as the past 3 Pap tests have been normal.   Some women have medical problems that increase the chance of getting cervical cancer. Talk to your caregiver about these problems. It is especially important to talk to your caregiver if a new problem develops soon after your last Pap test. In these cases, your caregiver may recommend more frequent screening and Pap tests.   The above recommendations are the same for women who have or have not gotten the vaccine for human papillomavirus (HPV).   If you had a hysterectomy for a problem that was not cancer or a condition that could lead to cancer, then you no longer need Pap tests. Even if you no longer need a Pap test, a regular exam is a good idea to make sure no other problems are  starting.   If you are between ages 65 and 70, and you have had normal Pap tests going back 10 years, you no longer need Pap tests. Even if you no longer need a Pap test, a regular exam is a good idea to make sure no other problems are starting.   If you have had past treatment for cervical cancer or a condition that could lead to cancer, you need Pap tests and screening for cancer for at least 20 years after your treatment.   If Pap tests have been discontinued, risk factors (such as a new sexual partner) need to be reassessed to determine if screening should be resumed.   The HPV test is an additional test that may be used for cervical cancer screening. The HPV test looks for the virus that can cause the cell changes on the cervix.   The cells collected during the Pap test can be tested for HPV. The HPV test could be used to screen women aged 30 years and older, and should be used in women of any age who have unclear Pap test results. After the age of 30, women should have HPV testing at the same frequency as a Pap test.   Colorectal cancer can be detected and often prevented. Most routine colorectal cancer screening begins at the age of 50 and continues through age 75. However, your caregiver may recommend screening at an earlier age if you have risk factors for colon cancer. On a yearly basis, your caregiver may provide home test kits to check for hidden blood in the stool. Use of a small camera at the end of a tube, to directly examine the colon (sigmoidoscopy or colonoscopy), can detect the earliest forms of colorectal cancer. Talk to your caregiver about this at age 50, when routine screening begins. Direct examination of the colon should be repeated every 5 to 10 years through age 75, unless early forms of pre-cancerous polyps or small growths are found.   Hepatitis C blood testing is recommended for all people born from 1945 through 1965 and any individual with known risks for hepatitis C.    Practice safe sex. Use condoms and avoid high-risk sexual practices to reduce the spread of sexually transmitted infections (STIs). STIs include gonorrhea, chlamydia, syphilis, trichomonas, herpes, HPV, and human immunodeficiency virus (HIV). Herpes, HIV, and HPV are viral illnesses that have no cure. They can result in disability, cancer, and death. Sexually active women aged 25 and younger should be checked for chlamydia. Older women with new or multiple partners should also be tested for chlamydia. Testing for other STIs is recommended if you are sexually active and at increased risk.   Osteoporosis is a disease in which the bones lose minerals and strength with aging. This can result in serious bone fractures. The risk of osteoporosis can be identified using a bone density scan. Women ages 65 and over and women at risk for fractures or osteoporosis should discuss screening with their caregivers. Ask your caregiver whether you should take a calcium supplement or vitamin D to reduce the rate of osteoporosis.   Menopause can be associated with physical symptoms and risks. Hormone replacement therapy is available to decrease symptoms and risks. You should talk to your caregiver about whether hormone replacement therapy is right for you.   Use sunscreen with sun protection factor (SPF) of 30 or more. Apply sunscreen liberally and repeatedly throughout the day. You should seek shade when your shadow is shorter than you. Protect yourself by wearing long sleeves, pants, a wide-brimmed hat, and sunglasses year round, whenever you are outdoors.   Once a month, do a whole body skin exam, using a mirror to look at the skin on your back. Notify your caregiver of new moles, moles that have irregular borders, moles that are larger than a pencil eraser, or moles that have changed in shape or color.   Stay current with required immunizations.   Influenza. You need a dose every fall (or winter). The composition of  the flu vaccine changes each year, so being vaccinated once is not enough.   Pneumococcal polysaccharide. You need 1 to 2 doses if you smoke cigarettes or if you have certain chronic medical conditions. You need 1 dose at age 65 (or older) if you have never been vaccinated.   Tetanus, diphtheria, pertussis (Tdap, Td). Get 1 dose of   Tdap vaccine if you are younger than age 65, are over 65 and have contact with an infant, are a healthcare worker, are pregnant, or simply want to be protected from whooping cough. After that, you need a Td booster dose every 10 years. Consult your caregiver if you have not had at least 3 tetanus and diphtheria-containing shots sometime in your life or have a deep or dirty wound.   HPV. You need this vaccine if you are a woman age 26 or younger. The vaccine is given in 3 doses over 6 months.   Measles, mumps, rubella (MMR). You need at least 1 dose of MMR if you were born in 1957 or later. You may also need a second dose.   Meningococcal. If you are age 19 to 21 and a first-year college student living in a residence hall, or have one of several medical conditions, you need to get vaccinated against meningococcal disease. You may also need additional booster doses.   Zoster (shingles). If you are age 60 or older, you should get this vaccine.   Varicella (chickenpox). If you have never had chickenpox or you were vaccinated but received only 1 dose, talk to your caregiver to find out if you need this vaccine.   Hepatitis A. You need this vaccine if you have a specific risk factor for hepatitis A virus infection or you simply wish to be protected from this disease. The vaccine is usually given as 2 doses, 6 to 18 months apart.   Hepatitis B. You need this vaccine if you have a specific risk factor for hepatitis B virus infection or you simply wish to be protected from this disease. The vaccine is given in 3 doses, usually over 6 months.  Preventive Services /  Frequency Ages 19 to 39  Blood pressure check.** / Every 1 to 2 years.   Lipid and cholesterol check.** / Every 5 years beginning at age 20.   Clinical breast exam.** / Every 3 years for women in their 20s and 30s.   Pap test.** / Every 2 years from ages 21 through 29. Every 3 years starting at age 30 through age 65 or 70 with a history of 3 consecutive normal Pap tests.   HPV screening.** / Every 3 years from ages 30 through ages 65 to 70 with a history of 3 consecutive normal Pap tests.   Hepatitis C blood test.** / For any individual with known risks for hepatitis C.   Skin self-exam. / Monthly.   Influenza immunization.** / Every year.   Pneumococcal polysaccharide immunization.** / 1 to 2 doses if you smoke cigarettes or if you have certain chronic medical conditions.   Tetanus, diphtheria, pertussis (Tdap, Td) immunization. / A one-time dose of Tdap vaccine. After that, you need a Td booster dose every 10 years.   HPV immunization. / 3 doses over 6 months, if you are 26 and younger.   Measles, mumps, rubella (MMR) immunization. / You need at least 1 dose of MMR if you were born in 1957 or later. You may also need a second dose.   Meningococcal immunization. / 1 dose if you are age 19 to 21 and a first-year college student living in a residence hall, or have one of several medical conditions, you need to get vaccinated against meningococcal disease. You may also need additional booster doses.   Varicella immunization.** / Consult your caregiver.   Hepatitis A immunization.** / Consult your caregiver. 2 doses, 6 to 18 months   apart.   Hepatitis B immunization.** / Consult your caregiver. 3 doses usually over 6 months.  Ages 40 to 64  Blood pressure check.** / Every 1 to 2 years.   Lipid and cholesterol check.** / Every 5 years beginning at age 20.   Clinical breast exam.** / Every year after age 40.   Mammogram.** / Every year beginning at age 40 and continuing for as  long as you are in good health. Consult with your caregiver.   Pap test.** / Every 3 years starting at age 30 through age 65 or 70 with a history of 3 consecutive normal Pap tests.   HPV screening.** / Every 3 years from ages 30 through ages 65 to 70 with a history of 3 consecutive normal Pap tests.   Fecal occult blood test (FOBT) of stool. / Every year beginning at age 50 and continuing until age 75. You may not need to do this test if you get a colonoscopy every 10 years.   Flexible sigmoidoscopy or colonoscopy.** / Every 5 years for a flexible sigmoidoscopy or every 10 years for a colonoscopy beginning at age 50 and continuing until age 75.   Hepatitis C blood test.** / For all people born from 1945 through 1965 and any individual with known risks for hepatitis C.   Skin self-exam. / Monthly.   Influenza immunization.** / Every year.   Pneumococcal polysaccharide immunization.** / 1 to 2 doses if you smoke cigarettes or if you have certain chronic medical conditions.   Tetanus, diphtheria, pertussis (Tdap, Td) immunization.** / A one-time dose of Tdap vaccine. After that, you need a Td booster dose every 10 years.   Measles, mumps, rubella (MMR) immunization. / You need at least 1 dose of MMR if you were born in 1957 or later. You may also need a second dose.   Varicella immunization.** / Consult your caregiver.   Meningococcal immunization.** / Consult your caregiver.   Hepatitis A immunization.** / Consult your caregiver. 2 doses, 6 to 18 months apart.   Hepatitis B immunization.** / Consult your caregiver. 3 doses, usually over 6 months.  Ages 65 and over  Blood pressure check.** / Every 1 to 2 years.   Lipid and cholesterol check.** / Every 5 years beginning at age 20.   Clinical breast exam.** / Every year after age 40.   Mammogram.** / Every year beginning at age 40 and continuing for as long as you are in good health. Consult with your caregiver.   Pap test.** /  Every 3 years starting at age 30 through age 65 or 70 with a 3 consecutive normal Pap tests. Testing can be stopped between 65 and 70 with 3 consecutive normal Pap tests and no abnormal Pap or HPV tests in the past 10 years.   HPV screening.** / Every 3 years from ages 30 through ages 65 or 70 with a history of 3 consecutive normal Pap tests. Testing can be stopped between 65 and 70 with 3 consecutive normal Pap tests and no abnormal Pap or HPV tests in the past 10 years.   Fecal occult blood test (FOBT) of stool. / Every year beginning at age 50 and continuing until age 75. You may not need to do this test if you get a colonoscopy every 10 years.   Flexible sigmoidoscopy or colonoscopy.** / Every 5 years for a flexible sigmoidoscopy or every 10 years for a colonoscopy beginning at age 50 and continuing until age 75.   Hepatitis   C blood test.** / For all people born from 1945 through 1965 and any individual with known risks for hepatitis C.   Osteoporosis screening.** / A one-time screening for women ages 65 and over and women at risk for fractures or osteoporosis.   Skin self-exam. / Monthly.   Influenza immunization.** / Every year.   Pneumococcal polysaccharide immunization.** / 1 dose at age 65 (or older) if you have never been vaccinated.   Tetanus, diphtheria, pertussis (Tdap, Td) immunization. / A one-time dose of Tdap vaccine if you are over 65 and have contact with an infant, are a healthcare worker, or simply want to be protected from whooping cough. After that, you need a Td booster dose every 10 years.   Varicella immunization.** / Consult your caregiver.   Meningococcal immunization.** / Consult your caregiver.   Hepatitis A immunization.** / Consult your caregiver. 2 doses, 6 to 18 months apart.   Hepatitis B immunization.** / Check with your caregiver. 3 doses, usually over 6 months.  ** Family history and personal history of risk and conditions may change your caregiver's  recommendations. Document Released: 06/18/2001 Document Revised: 04/11/2011 Document Reviewed: 09/17/2010 [employer] Patient Information 2012 [employer]. 

## 2011-07-18 NOTE — Progress Notes (Signed)
  Subjective:     Kelli Gould is a 56 y.o. female and is here for a comprehensive physical exam. The patient reports no problems.  History   Social History  . Marital Status: Married    Spouse Name: N/A    Number of Children: N/A  . Years of Education: N/A   Occupational History  . Not on file.   Social History Main Topics  . Smoking status: Never Smoker   . Smokeless tobacco: Never Used  . Alcohol Use: No  . Drug Use: No  . Sexually Active: Yes -- Female partner(s)   Other Topics Concern  . Not on file   Social History Narrative   Exercise--walk---not enough per pt   Health Maintenance  Topic Date Due  . Tetanus/tdap  04/18/1975  . Influenza Vaccine  02/04/2012  . Mammogram  03/19/2013  . Pap Smear  03/19/2014  . Colonoscopy  03/06/2020    The following portions of the patient's history were reviewed and updated as appropriate: allergies, current medications, past family history, past medical history, past social history, past surgical history and problem list.  Review of Systems Review of Systems  Constitutional: Negative for activity change, appetite change and fatigue.  HENT: Negative for hearing loss, congestion, tinnitus and ear discharge.  dentist q78m Eyes: Negative for visual disturbance (see optho q1y -- vision corrected to 20/20 with glasses).  Respiratory: Negative for cough, chest tightness and shortness of breath.   Cardiovascular: Negative for chest pain, palpitations and leg swelling.  Gastrointestinal: Negative for abdominal pain, diarrhea, constipation and abdominal distention.  Genitourinary: Negative for urgency, frequency, decreased urine volume and difficulty urinating.  Musculoskeletal: Negative for back pain, arthralgias and gait problem.  Skin: Negative for color change, pallor and rash.  Neurological: Negative for dizziness, light-headedness, numbness and headaches.  Hematological: Negative for adenopathy. Does not bruise/bleed easily.    Psychiatric/Behavioral: Negative for suicidal ideas, confusion, sleep disturbance, self-injury, dysphoric mood, decreased concentration and agitation.       Objective:    BP 126/82  Pulse 81  Temp(Src) 97.9 F (36.6 C) (Oral)  Ht 5' 3.5" (1.613 m)  Wt 207 lb 9.6 oz (94.167 kg)  BMI 36.20 kg/m2  SpO2 96% General appearance: alert, cooperative, appears stated age and no distress Head: Normocephalic, without obvious abnormality, atraumatic Eyes: conjunctivae/corneas clear. PERRL, EOM's intact. Fundi benign. Ears: normal TM's and external ear canals both ears Nose: Nares normal. Septum midline. Mucosa normal. No drainage or sinus tenderness. Throat: lips, mucosa, and tongue normal; teeth and gums normal Neck: no adenopathy, no carotid bruit, no JVD, supple, symmetrical, trachea midline and thyroid not enlarged, symmetric, no tenderness/mass/nodules Back: symmetric, no curvature. ROM normal. No CVA tenderness. Lungs: clear to auscultation bilaterally Breasts: gyn Heart: regular rate and rhythm, S1, S2 normal, no murmur, click, rub or gallop Abdomen: soft, non-tender; bowel sounds normal; no masses,  no organomegaly Pelvic: gyn Extremities: extremities normal, atraumatic, no cyanosis or edema Pulses: 2+ and symmetric Skin: Skin color, texture, turgor normal. No rashes or lesions Lymph nodes: Cervical, supraclavicular, and axillary nodes normal. Neurologic: Alert and oriented X 3, normal strength and tone. Normal symmetric reflexes. Normal coordination and gait psych--  no depression , no anxiety    Assessment:    Healthy female exam.      Plan:  mammagraphy and pap per gyn  ghm Check labs See After Visit Summary for Counseling Recommendations

## 2011-08-01 ENCOUNTER — Other Ambulatory Visit: Payer: Managed Care, Other (non HMO)

## 2011-08-02 LAB — HEPATIC FUNCTION PANEL
Albumin: 4.3 g/dL (ref 3.5–5.2)
Total Bilirubin: 0.3 mg/dL (ref 0.3–1.2)

## 2012-06-20 ENCOUNTER — Other Ambulatory Visit: Payer: Self-pay

## 2012-10-16 ENCOUNTER — Ambulatory Visit (INDEPENDENT_AMBULATORY_CARE_PROVIDER_SITE_OTHER): Payer: Managed Care, Other (non HMO) | Admitting: Family Medicine

## 2012-10-16 ENCOUNTER — Other Ambulatory Visit (HOSPITAL_COMMUNITY)
Admission: RE | Admit: 2012-10-16 | Discharge: 2012-10-16 | Disposition: A | Discharge status: Home / Self Care | Payer: Managed Care, Other (non HMO) | Source: Ambulatory Visit | Attending: Family Medicine | Admitting: Family Medicine

## 2012-10-16 ENCOUNTER — Encounter: Payer: Self-pay | Admitting: Family Medicine

## 2012-10-16 VITALS — BP 132/86 | HR 87 | Temp 98.2°F | Ht 63.5 in | Wt 207.2 lb

## 2012-10-16 DIAGNOSIS — Z01419 Encounter for gynecological examination (general) (routine) without abnormal findings: Secondary | ICD-10-CM | POA: Insufficient documentation

## 2012-10-16 DIAGNOSIS — G8929 Other chronic pain: Secondary | ICD-10-CM

## 2012-10-16 DIAGNOSIS — R030 Elevated blood-pressure reading, without diagnosis of hypertension: Secondary | ICD-10-CM

## 2012-10-16 DIAGNOSIS — M549 Dorsalgia, unspecified: Secondary | ICD-10-CM

## 2012-10-16 DIAGNOSIS — F411 Generalized anxiety disorder: Secondary | ICD-10-CM

## 2012-10-16 DIAGNOSIS — Z0000 Encounter for general adult medical examination without abnormal findings: Secondary | ICD-10-CM

## 2012-10-16 DIAGNOSIS — Z124 Encounter for screening for malignant neoplasm of cervix: Secondary | ICD-10-CM

## 2012-10-16 DIAGNOSIS — IMO0001 Reserved for inherently not codable concepts without codable children: Secondary | ICD-10-CM | POA: Insufficient documentation

## 2012-10-16 LAB — LIPID PANEL
Cholesterol: 214 mg/dL — ABNORMAL HIGH (ref 0–200)
HDL: 34.9 mg/dL — ABNORMAL LOW (ref >39.00)
Triglycerides: 235 mg/dL — ABNORMAL HIGH (ref 0.0–149.0)
VLDL: 47 mg/dL — ABNORMAL HIGH (ref 0.0–40.0)

## 2012-10-16 LAB — BASIC METABOLIC PANEL
CO2: 23 mEq/L (ref 19–32)
Calcium: 9.5 mg/dL (ref 8.4–10.5)
Chloride: 109 mEq/L (ref 96–112)
Sodium: 143 mEq/L (ref 135–145)

## 2012-10-16 LAB — TSH: TSH: 1.37 u[IU]/mL (ref 0.35–5.50)

## 2012-10-16 LAB — POCT URINALYSIS DIPSTICK
Bilirubin, UA: NEGATIVE
Ketones, UA: NEGATIVE
Leukocytes, UA: NEGATIVE
Spec Grav, UA: 1.02

## 2012-10-16 LAB — CBC WITH DIFFERENTIAL/PLATELET
Basophils Relative: 0.7 % (ref 0.0–3.0)
Eosinophils Absolute: 0.1 10*3/uL (ref 0.0–0.7)
Hemoglobin: 14.1 g/dL (ref 12.0–15.0)
Lymphocytes Relative: 21.4 % (ref 12.0–46.0)
MCHC: 33.5 g/dL (ref 30.0–36.0)
MCV: 97.2 fl (ref 78.0–100.0)
Neutro Abs: 5.7 10*3/uL (ref 1.4–7.7)
RBC: 4.32 Mil/uL (ref 3.87–5.11)

## 2012-10-16 LAB — HEPATIC FUNCTION PANEL
AST: 18 U/L (ref 0–37)
Alkaline Phosphatase: 89 U/L (ref 39–117)

## 2012-10-16 MED ORDER — ALPRAZOLAM 0.5 MG PO TABS: 0.5000 mg | ORAL_TABLET | Freq: Three times a day (TID) | ORAL | Status: DC | PRN

## 2012-10-16 NOTE — Assessment & Plan Note (Signed)
Stable Occasional ultram only

## 2012-10-16 NOTE — Progress Notes (Signed)
Subjective:     Kelli Gould is a 57 y.o. female and is here for a comprehensive physical exam. The patient reports no problems.  History   Social History  . Marital Status: Married    Spouse Name: N/A    Number of Children: N/A  . Years of Education: N/A   Occupational History  . Not on file.   Social History Main Topics  . Smoking status: Never Smoker   . Smokeless tobacco: Never Used  . Alcohol Use: No  . Drug Use: No  . Sexually Active: Yes -- Female partner(s)   Other Topics Concern  . Not on file   Social History Narrative   Exercise--walk---not enough per pt   Health Maintenance  Topic Date Due  . Influenza Vaccine  01/04/2013  . Mammogram  03/19/2013  . Pap Smear  03/19/2014  . Colonoscopy  03/06/2020  . Tetanus/tdap  07/17/2021    The following portions of the patient's history were reviewed and updated as appropriate:  She  has a past medical history of Anxiety. She  does not have any pertinent problems on file. She  has past surgical history that includes Cholecystectomy (1994); Appendectomy (1995); Back surgery; Spine surgery (9604,54,09); and Vaginal hysterectomy (1994). Her family history includes Breast cancer in her maternal aunt; Diabetes (age of onset: 43) in her father; Heart disease (age of onset: 51) in her mother; and Hypertension (age of onset: 20) in her mother. She  reports that she has never smoked. She has never used smokeless tobacco. She reports that she does not drink alcohol or use illicit drugs. She has a current medication list which includes the following prescription(s): alprazolam, multivitamin, and tramadol. Current Outpatient Prescriptions on File Prior to Visit  Medication Sig Dispense Refill  . Multiple Vitamin (MULTIVITAMIN) tablet Take 1 tablet by mouth daily.      . traMADol (ULTRAM) 50 MG tablet Take 50 mg by mouth every 6 (six) hours as needed.       No current facility-administered medications on file prior to visit.    She has No Known Allergies..  Review of Systems Review of Systems  Constitutional: Negative for activity change, appetite change and fatigue.  HENT: Negative for hearing loss, congestion, tinnitus and ear discharge.  dentist q19m Eyes: Negative for visual disturbance (see optho q1y -- vision corrected to 20/20 with glasses).  Respiratory: Negative for cough, chest tightness and shortness of breath.   Cardiovascular: Negative for chest pain, palpitations and leg swelling.  Gastrointestinal: Negative for abdominal pain, diarrhea, constipation and abdominal distention.  Genitourinary: Negative for urgency, frequency, decreased urine volume and difficulty urinating.  Musculoskeletal: neg, arthralgias and gait problem. + chronic back pain Skin: Negative for color change, pallor and rash.  Neurological: Negative for dizziness, light-headedness, numbness and headaches.  Hematological: Negative for adenopathy. Does not bruise/bleed easily.  Psychiatric/Behavioral: Negative for suicidal ideas, confusion, sleep disturbance, self-injury, dysphoric mood, decreased concentration and agitation.       Objective:    BP 132/86  Pulse 87  Temp(Src) 98.2 F (36.8 C) (Oral)  Ht 5' 3.5" (1.613 m)  Wt 207 lb 3.2 oz (93.985 kg)  BMI 36.12 kg/m2  SpO2 97% General appearance: alert, cooperative, appears stated age and no distress Head: Normocephalic, without obvious abnormality, atraumatic Eyes: conjunctivae/corneas clear. PERRL, EOM's intact. Fundi benign. Ears: normal TM's and external ear canals both ears Nose: Nares normal. Septum midline. Mucosa normal. No drainage or sinus tenderness. Throat: lips, mucosa, and tongue normal;  teeth and gums normal Neck: no adenopathy, no carotid bruit, no JVD, supple, symmetrical, trachea midline and thyroid not enlarged, symmetric, no tenderness/mass/nodules Back: symmetric, no curvature. ROM normal. No CVA tenderness. Lungs: clear to auscultation  bilaterally Breasts: normal appearance, no masses or tenderness Heart: regular rate and rhythm, S1, S2 normal, no murmur, click, rub or gallop Abdomen: soft, non-tender; bowel sounds normal; no masses,  no organomegaly Pelvic: external genitalia normal, no adnexal masses or tenderness, rectovaginal septum normal, uterus surgically absent and scant amount white d/c, no odor Extremities: extremities normal, atraumatic, no cyanosis or edema Pulses: 2+ and symmetric Skin: Skin color, texture, turgor normal. No rashes or lesions Lymph nodes: Cervical, supraclavicular, and axillary nodes normal. Neurologic: Alert and oriented X 3, normal strength and tone. Normal symmetric reflexes. Normal coordination and gait Psych- no depression, no anxiety      Assessment:    Healthy female exam.      Plan:    ghm utd  Check labs  See After Visit Summary for Counseling Recommendations

## 2012-10-16 NOTE — Addendum Note (Signed)
Addended by: Arnette Norris on: 10/16/2012 01:38 PM   Modules accepted: Orders

## 2012-10-16 NOTE — Patient Instructions (Addendum)
Preventive Care for Adults, Female A healthy lifestyle and preventive care can promote health and wellness. Preventive health guidelines for women include the following key practices.  A routine yearly physical is a good way to check with your caregiver about your health and preventive screening. It is a chance to share any concerns and updates on your health, and to receive a thorough exam.  Visit your dentist for a routine exam and preventive care every 6 months. Brush your teeth twice a day and floss once a day. Good oral hygiene prevents tooth decay and gum disease.  The frequency of eye exams is based on your age, health, family medical history, use of contact lenses, and other factors. Follow your caregiver's recommendations for frequency of eye exams.  Eat a healthy diet. Foods like vegetables, fruits, whole grains, low-fat dairy products, and lean protein foods contain the nutrients you need without too many calories. Decrease your intake of foods high in solid fats, added sugars, and salt. Eat the right amount of calories for you.Get information about a proper diet from your caregiver, if necessary.  Regular physical exercise is one of the most important things you can do for your health. Most adults should get at least 150 minutes of moderate-intensity exercise (any activity that increases your heart rate and causes you to sweat) each week. In addition, most adults need muscle-strengthening exercises on 2 or more days a week.  Maintain a healthy weight. The body mass index (BMI) is a screening tool to identify possible weight problems. It provides an estimate of body fat based on height and weight. Your caregiver can help determine your BMI, and can help you achieve or maintain a healthy weight.For adults 20 years and older:  A BMI below 18.5 is considered underweight.  A BMI of 18.5 to 24.9 is normal.  A BMI of 25 to 29.9 is considered overweight.  A BMI of 30 and above is  considered obese.  Maintain normal blood lipids and cholesterol levels by exercising and minimizing your intake of saturated fat. Eat a balanced diet with plenty of fruit and vegetables. Blood tests for lipids and cholesterol should begin at age 20 and be repeated every 5 years. If your lipid or cholesterol levels are high, you are over 50, or you are at high risk for heart disease, you may need your cholesterol levels checked more frequently.Ongoing high lipid and cholesterol levels should be treated with medicines if diet and exercise are not effective.  If you smoke, find out from your caregiver how to quit. If you do not use tobacco, do not start.  If you are pregnant, do not drink alcohol. If you are breastfeeding, be very cautious about drinking alcohol. If you are not pregnant and choose to drink alcohol, do not exceed 1 drink per day. One drink is considered to be 12 ounces (355 mL) of beer, 5 ounces (148 mL) of wine, or 1.5 ounces (44 mL) of liquor.  Avoid use of street drugs. Do not share needles with anyone. Ask for help if you need support or instructions about stopping the use of drugs.  High blood pressure causes heart disease and increases the risk of stroke. Your blood pressure should be checked at least every 1 to 2 years. Ongoing high blood pressure should be treated with medicines if weight loss and exercise are not effective.  If you are 55 to 57 years old, ask your caregiver if you should take aspirin to prevent strokes.  Diabetes   screening involves taking a blood sample to check your fasting blood sugar level. This should be done once every 3 years, after age 45, if you are within normal weight and without risk factors for diabetes. Testing should be considered at a younger age or be carried out more frequently if you are overweight and have at least 1 risk factor for diabetes.  Breast cancer screening is essential preventive care for women. You should practice "breast  self-awareness." This means understanding the normal appearance and feel of your breasts and may include breast self-examination. Any changes detected, no matter how small, should be reported to a caregiver. Women in their 20s and 30s should have a clinical breast exam (CBE) by a caregiver as part of a regular health exam every 1 to 3 years. After age 40, women should have a CBE every year. Starting at age 40, women should consider having a mammography (breast X-ray test) every year. Women who have a family history of breast cancer should talk to their caregiver about genetic screening. Women at a high risk of breast cancer should talk to their caregivers about having magnetic resonance imaging (MRI) and a mammography every year.  The Pap test is a screening test for cervical cancer. A Pap test can show cell changes on the cervix that might become cervical cancer if left untreated. A Pap test is a procedure in which cells are obtained and examined from the lower end of the uterus (cervix).  Women should have a Pap test starting at age 21.  Between ages 21 and 29, Pap tests should be repeated every 2 years.  Beginning at age 30, you should have a Pap test every 3 years as long as the past 3 Pap tests have been normal.  Some women have medical problems that increase the chance of getting cervical cancer. Talk to your caregiver about these problems. It is especially important to talk to your caregiver if a new problem develops soon after your last Pap test. In these cases, your caregiver may recommend more frequent screening and Pap tests.  The above recommendations are the same for women who have or have not gotten the vaccine for human papillomavirus (HPV).  If you had a hysterectomy for a problem that was not cancer or a condition that could lead to cancer, then you no longer need Pap tests. Even if you no longer need a Pap test, a regular exam is a good idea to make sure no other problems are  starting.  If you are between ages 65 and 70, and you have had normal Pap tests going back 10 years, you no longer need Pap tests. Even if you no longer need a Pap test, a regular exam is a good idea to make sure no other problems are starting.  If you have had past treatment for cervical cancer or a condition that could lead to cancer, you need Pap tests and screening for cancer for at least 20 years after your treatment.  If Pap tests have been discontinued, risk factors (such as a new sexual partner) need to be reassessed to determine if screening should be resumed.  The HPV test is an additional test that may be used for cervical cancer screening. The HPV test looks for the virus that can cause the cell changes on the cervix. The cells collected during the Pap test can be tested for HPV. The HPV test could be used to screen women aged 30 years and older, and should   be used in women of any age who have unclear Pap test results. After the age of 30, women should have HPV testing at the same frequency as a Pap test.  Colorectal cancer can be detected and often prevented. Most routine colorectal cancer screening begins at the age of 50 and continues through age 75. However, your caregiver may recommend screening at an earlier age if you have risk factors for colon cancer. On a yearly basis, your caregiver may provide home test kits to check for hidden blood in the stool. Use of a small camera at the end of a tube, to directly examine the colon (sigmoidoscopy or colonoscopy), can detect the earliest forms of colorectal cancer. Talk to your caregiver about this at age 50, when routine screening begins. Direct examination of the colon should be repeated every 5 to 10 years through age 75, unless early forms of pre-cancerous polyps or small growths are found.  Hepatitis C blood testing is recommended for all people born from 1945 through 1965 and any individual with known risks for hepatitis C.  Practice  safe sex. Use condoms and avoid high-risk sexual practices to reduce the spread of sexually transmitted infections (STIs). STIs include gonorrhea, chlamydia, syphilis, trichomonas, herpes, HPV, and human immunodeficiency virus (HIV). Herpes, HIV, and HPV are viral illnesses that have no cure. They can result in disability, cancer, and death. Sexually active women aged 25 and younger should be checked for chlamydia. Older women with new or multiple partners should also be tested for chlamydia. Testing for other STIs is recommended if you are sexually active and at increased risk.  Osteoporosis is a disease in which the bones lose minerals and strength with aging. This can result in serious bone fractures. The risk of osteoporosis can be identified using a bone density scan. Women ages 65 and over and women at risk for fractures or osteoporosis should discuss screening with their caregivers. Ask your caregiver whether you should take a calcium supplement or vitamin D to reduce the rate of osteoporosis.  Menopause can be associated with physical symptoms and risks. Hormone replacement therapy is available to decrease symptoms and risks. You should talk to your caregiver about whether hormone replacement therapy is right for you.  Use sunscreen with sun protection factor (SPF) of 30 or more. Apply sunscreen liberally and repeatedly throughout the day. You should seek shade when your shadow is shorter than you. Protect yourself by wearing long sleeves, pants, a wide-brimmed hat, and sunglasses year round, whenever you are outdoors.  Once a month, do a whole body skin exam, using a mirror to look at the skin on your back. Notify your caregiver of new moles, moles that have irregular borders, moles that are larger than a pencil eraser, or moles that have changed in shape or color.  Stay current with required immunizations.  Influenza. You need a dose every fall (or winter). The composition of the flu vaccine  changes each year, so being vaccinated once is not enough.  Pneumococcal polysaccharide. You need 1 to 2 doses if you smoke cigarettes or if you have certain chronic medical conditions. You need 1 dose at age 65 (or older) if you have never been vaccinated.  Tetanus, diphtheria, pertussis (Tdap, Td). Get 1 dose of Tdap vaccine if you are younger than age 65, are over 65 and have contact with an infant, are a healthcare worker, are pregnant, or simply want to be protected from whooping cough. After that, you need a Td   booster dose every 10 years. Consult your caregiver if you have not had at least 3 tetanus and diphtheria-containing shots sometime in your life or have a deep or dirty wound.  HPV. You need this vaccine if you are a woman age 26 or younger. The vaccine is given in 3 doses over 6 months.  Measles, mumps, rubella (MMR). You need at least 1 dose of MMR if you were born in 1957 or later. You may also need a second dose.  Meningococcal. If you are age 19 to 21 and a first-year college student living in a residence hall, or have one of several medical conditions, you need to get vaccinated against meningococcal disease. You may also need additional booster doses.  Zoster (shingles). If you are age 60 or older, you should get this vaccine.  Varicella (chickenpox). If you have never had chickenpox or you were vaccinated but received only 1 dose, talk to your caregiver to find out if you need this vaccine.  Hepatitis A. You need this vaccine if you have a specific risk factor for hepatitis A virus infection or you simply wish to be protected from this disease. The vaccine is usually given as 2 doses, 6 to 18 months apart.  Hepatitis B. You need this vaccine if you have a specific risk factor for hepatitis B virus infection or you simply wish to be protected from this disease. The vaccine is given in 3 doses, usually over 6 months. Preventive Services / Frequency Ages 19 to 39  Blood  pressure check.** / Every 1 to 2 years.  Lipid and cholesterol check.** / Every 5 years beginning at age 20.  Clinical breast exam.** / Every 3 years for women in their 20s and 30s.  Pap test.** / Every 2 years from ages 21 through 29. Every 3 years starting at age 30 through age 65 or 70 with a history of 3 consecutive normal Pap tests.  HPV screening.** / Every 3 years from ages 30 through ages 65 to 70 with a history of 3 consecutive normal Pap tests.  Hepatitis C blood test.** / For any individual with known risks for hepatitis C.  Skin self-exam. / Monthly.  Influenza immunization.** / Every year.  Pneumococcal polysaccharide immunization.** / 1 to 2 doses if you smoke cigarettes or if you have certain chronic medical conditions.  Tetanus, diphtheria, pertussis (Tdap, Td) immunization. / A one-time dose of Tdap vaccine. After that, you need a Td booster dose every 10 years.  HPV immunization. / 3 doses over 6 months, if you are 26 and younger.  Measles, mumps, rubella (MMR) immunization. / You need at least 1 dose of MMR if you were born in 1957 or later. You may also need a second dose.  Meningococcal immunization. / 1 dose if you are age 19 to 21 and a first-year college student living in a residence hall, or have one of several medical conditions, you need to get vaccinated against meningococcal disease. You may also need additional booster doses.  Varicella immunization.** / Consult your caregiver.  Hepatitis A immunization.** / Consult your caregiver. 2 doses, 6 to 18 months apart.  Hepatitis B immunization.** / Consult your caregiver. 3 doses usually over 6 months. Ages 40 to 64  Blood pressure check.** / Every 1 to 2 years.  Lipid and cholesterol check.** / Every 5 years beginning at age 20.  Clinical breast exam.** / Every year after age 40.  Mammogram.** / Every year beginning at age 40   and continuing for as long as you are in good health. Consult with your  caregiver.  Pap test.** / Every 3 years starting at age 30 through age 65 or 70 with a history of 3 consecutive normal Pap tests.  HPV screening.** / Every 3 years from ages 30 through ages 65 to 70 with a history of 3 consecutive normal Pap tests.  Fecal occult blood test (FOBT) of stool. / Every year beginning at age 50 and continuing until age 75. You may not need to do this test if you get a colonoscopy every 10 years.  Flexible sigmoidoscopy or colonoscopy.** / Every 5 years for a flexible sigmoidoscopy or every 10 years for a colonoscopy beginning at age 50 and continuing until age 75.  Hepatitis C blood test.** / For all people born from 1945 through 1965 and any individual with known risks for hepatitis C.  Skin self-exam. / Monthly.  Influenza immunization.** / Every year.  Pneumococcal polysaccharide immunization.** / 1 to 2 doses if you smoke cigarettes or if you have certain chronic medical conditions.  Tetanus, diphtheria, pertussis (Tdap, Td) immunization.** / A one-time dose of Tdap vaccine. After that, you need a Td booster dose every 10 years.  Measles, mumps, rubella (MMR) immunization. / You need at least 1 dose of MMR if you were born in 1957 or later. You may also need a second dose.  Varicella immunization.** / Consult your caregiver.  Meningococcal immunization.** / Consult your caregiver.  Hepatitis A immunization.** / Consult your caregiver. 2 doses, 6 to 18 months apart.  Hepatitis B immunization.** / Consult your caregiver. 3 doses, usually over 6 months. Ages 65 and over  Blood pressure check.** / Every 1 to 2 years.  Lipid and cholesterol check.** / Every 5 years beginning at age 20.  Clinical breast exam.** / Every year after age 40.  Mammogram.** / Every year beginning at age 40 and continuing for as long as you are in good health. Consult with your caregiver.  Pap test.** / Every 3 years starting at age 30 through age 65 or 70 with a 3  consecutive normal Pap tests. Testing can be stopped between 65 and 70 with 3 consecutive normal Pap tests and no abnormal Pap or HPV tests in the past 10 years.  HPV screening.** / Every 3 years from ages 30 through ages 65 or 70 with a history of 3 consecutive normal Pap tests. Testing can be stopped between 65 and 70 with 3 consecutive normal Pap tests and no abnormal Pap or HPV tests in the past 10 years.  Fecal occult blood test (FOBT) of stool. / Every year beginning at age 50 and continuing until age 75. You may not need to do this test if you get a colonoscopy every 10 years.  Flexible sigmoidoscopy or colonoscopy.** / Every 5 years for a flexible sigmoidoscopy or every 10 years for a colonoscopy beginning at age 50 and continuing until age 75.  Hepatitis C blood test.** / For all people born from 1945 through 1965 and any individual with known risks for hepatitis C.  Osteoporosis screening.** / A one-time screening for women ages 65 and over and women at risk for fractures or osteoporosis.  Skin self-exam. / Monthly.  Influenza immunization.** / Every year.  Pneumococcal polysaccharide immunization.** / 1 dose at age 65 (or older) if you have never been vaccinated.  Tetanus, diphtheria, pertussis (Tdap, Td) immunization. / A one-time dose of Tdap vaccine if you are over   65 and have contact with an infant, are a healthcare worker, or simply want to be protected from whooping cough. After that, you need a Td booster dose every 10 years.  Varicella immunization.** / Consult your caregiver.  Meningococcal immunization.** / Consult your caregiver.  Hepatitis A immunization.** / Consult your caregiver. 2 doses, 6 to 18 months apart.  Hepatitis B immunization.** / Check with your caregiver. 3 doses, usually over 6 months. ** Family history and personal history of risk and conditions may change your caregiver's recommendations. Document Released: 06/18/2001 Document Revised: 07/15/2011  Document Reviewed: 09/17/2010 [employer] Patient Information 2014 [employer].  

## 2012-10-16 NOTE — Assessment & Plan Note (Signed)
Pt under a lot of stress and has not been able to exercise Now that things are settling down she will be more aware of diet and exercise Recheck 3-4 weeks

## 2012-12-14 ENCOUNTER — Encounter: Payer: Managed Care, Other (non HMO) | Admitting: Family Medicine

## 2013-03-11 ENCOUNTER — Other Ambulatory Visit: Payer: Self-pay

## 2013-03-29 ENCOUNTER — Other Ambulatory Visit: Payer: Self-pay | Admitting: Family Medicine

## 2013-03-30 NOTE — Telephone Encounter (Signed)
Last seen and filled 10/16/12 #60. Please advise     KP

## 2013-07-16 ENCOUNTER — Other Ambulatory Visit: Payer: Self-pay | Admitting: Family Medicine

## 2013-07-16 NOTE — Telephone Encounter (Signed)
Last seen 10/16/12 and filled 03/29/13 #60. Please advise     KP

## 2013-10-18 ENCOUNTER — Ambulatory Visit (INDEPENDENT_AMBULATORY_CARE_PROVIDER_SITE_OTHER): Payer: Managed Care, Other (non HMO) | Admitting: Family Medicine

## 2013-10-18 ENCOUNTER — Encounter: Payer: Self-pay | Admitting: Family Medicine

## 2013-10-18 VITALS — BP 116/82 | HR 85 | Temp 97.8°F | Wt 209.0 lb

## 2013-10-18 DIAGNOSIS — K219 Gastro-esophageal reflux disease without esophagitis: Secondary | ICD-10-CM

## 2013-10-18 MED ORDER — GI COCKTAIL ~~LOC~~
30.0000 mL | Freq: Once | ORAL | Status: AC
  Administered 2013-10-18: 30 mL via ORAL

## 2013-10-18 NOTE — Progress Notes (Signed)
  Subjective:     Kelli Gould is a 58 y.o. female who presents for evaluation of abdominal pain. Onset was a few weeks ago. Symptoms have been unchanged. The pain is described as burning and sharp, and is 7/10 in intensity. Pain is located in the epigastric region without radiation.  Aggravating factors: eating.  Alleviating factors: antacids. Associated symptoms: belching and nausea. The patient denies arthralagias, chills, constipation, diarrhea, dysuria, fever, flatus, frequency, headache, hematochezia, hematuria, melena, myalgias, sweats and vomiting.  The patient's history has been marked as reviewed and updated as appropriate.  Review of Systems Pertinent items are noted in HPI.     Objective:    BP 116/82  Pulse 85  Temp(Src) 97.8 F (36.6 C) (Oral)  Wt 209 lb (94.802 kg)  SpO2 99% General appearance: alert, cooperative, appears stated age and no distress Abdomen: abnormal findings:  marked tenderness in the epigastrium    Assessment:    Abdominal pain, likely secondary to gerd .    Plan:    The diagnosis was discussed with the patient and evaluation and treatment plans outlined. See orders for lab and imaging studies. Adhere to simple, bland diet. Initiate empiric trial of acid suppression; see orders. Further follow-up plans will be based on outcome of lab/imaging studies; see orders. Follow up in 1 month or as needed.

## 2013-10-18 NOTE — Addendum Note (Signed)
Addended by: Arnette NorrisPAYNE, Laylonie Marzec P on: 10/18/2013 01:44 PM   Modules accepted: Orders

## 2013-10-18 NOTE — Patient Instructions (Signed)

## 2013-10-18 NOTE — Progress Notes (Signed)
Pre visit review using our clinic review tool, if applicable. No additional management support is needed unless otherwise documented below in the visit note. 

## 2013-10-19 ENCOUNTER — Telehealth: Payer: Self-pay | Admitting: *Deleted

## 2013-10-19 ENCOUNTER — Other Ambulatory Visit: Payer: Self-pay | Admitting: Family Medicine

## 2013-10-19 DIAGNOSIS — K219 Gastro-esophageal reflux disease without esophagitis: Secondary | ICD-10-CM

## 2013-10-19 LAB — BASIC METABOLIC PANEL
BUN: 14 mg/dL (ref 6–23)
CHLORIDE: 104 meq/L (ref 96–112)
CO2: 29 mEq/L (ref 19–32)
Calcium: 9.7 mg/dL (ref 8.4–10.5)
Creatinine, Ser: 0.8 mg/dL (ref 0.4–1.2)
GFR: 81.98 mL/min (ref >60.00)
Glucose, Bld: 107 mg/dL — ABNORMAL HIGH (ref 70–99)
POTASSIUM: 4.2 meq/L (ref 3.5–5.1)
SODIUM: 137 meq/L (ref 135–145)

## 2013-10-19 LAB — CBC WITH DIFFERENTIAL/PLATELET
BASOS PCT: 0.5 % (ref 0.0–3.0)
Basophils Absolute: 0 10*3/uL (ref 0.0–0.1)
EOS PCT: 0.9 % (ref 0.0–5.0)
Eosinophils Absolute: 0.1 10*3/uL (ref 0.0–0.7)
HEMATOCRIT: 40.2 % (ref 36.0–46.0)
Hemoglobin: 13.5 g/dL (ref 12.0–15.0)
LYMPHS ABS: 2.1 10*3/uL (ref 0.7–4.0)
Lymphocytes Relative: 24.8 % (ref 12.0–46.0)
MCHC: 33.6 g/dL (ref 30.0–36.0)
MCV: 95.2 fl (ref 78.0–100.0)
MONO ABS: 0.4 10*3/uL (ref 0.1–1.0)
MONOS PCT: 4.1 % (ref 3.0–12.0)
Neutro Abs: 6 10*3/uL (ref 1.4–7.7)
Neutrophils Relative %: 69.7 % (ref 43.0–77.0)
Platelets: 308 10*3/uL (ref 150.0–400.0)
RBC: 4.22 Mil/uL (ref 3.87–5.11)
RDW: 12.8 % (ref 11.5–15.5)
WBC: 8.6 10*3/uL (ref 4.0–10.5)

## 2013-10-19 LAB — HEPATIC FUNCTION PANEL
ALBUMIN: 4.2 g/dL (ref 3.5–5.2)
ALK PHOS: 117 U/L (ref 39–117)
ALT: 19 U/L (ref 0–35)
AST: 24 U/L (ref 0–37)
BILIRUBIN TOTAL: 0.4 mg/dL (ref 0.2–1.2)
Bilirubin, Direct: 0.1 mg/dL (ref 0.0–0.3)
Total Protein: 7.6 g/dL (ref 6.0–8.3)

## 2013-10-19 LAB — AMYLASE: AMYLASE: 59 U/L (ref 27–131)

## 2013-10-19 LAB — H. PYLORI ANTIBODY, IGG: H Pylori IgG: NEGATIVE

## 2013-10-19 LAB — LIPASE: Lipase: 41 U/L (ref 11.0–59.0)

## 2013-10-19 MED ORDER — OMEPRAZOLE 40 MG PO CPDR
40.0000 mg | DELAYED_RELEASE_CAPSULE | Freq: Every day | ORAL | Status: DC
Start: 2013-10-19 — End: 2013-12-16

## 2013-10-19 NOTE — Telephone Encounter (Signed)
Caller name:  Darel HongJudy Relation to pt:  self Call back number: [phone] Pharmacy:  [facility] in [place]  Reason for call:   Pt was seen yesterday, and went to pharmacy once and called pharmacy once to pick up prescription that she thought was going to be called in for her to help her stomach.  Pharmacy did not receive, and no orders are in her chart.  Please advise.  bw

## 2013-10-19 NOTE — Telephone Encounter (Signed)
Please advise on RX.     KP

## 2013-10-19 NOTE — Telephone Encounter (Signed)
Vm left to make aware.      KP

## 2013-10-19 NOTE — Telephone Encounter (Signed)
Omeprazole sent to pharmacy.

## 2013-11-03 ENCOUNTER — Telehealth: Payer: Self-pay | Admitting: Family Medicine

## 2013-11-03 DIAGNOSIS — R1013 Epigastric pain: Secondary | ICD-10-CM

## 2013-11-03 DIAGNOSIS — K219 Gastro-esophageal reflux disease without esophagitis: Secondary | ICD-10-CM

## 2013-11-03 NOTE — Telephone Encounter (Signed)
Ok to refer.

## 2013-11-03 NOTE — Telephone Encounter (Signed)
Please advise 

## 2013-11-03 NOTE — Telephone Encounter (Signed)
Caller name: Darel HongJudy  Call back number:[phone]   Reason for call:   Pt is still having stomach issues.  Would like a referral to [facility].  Molly Maduroobert Buccini

## 2013-11-04 NOTE — Telephone Encounter (Signed)
Referral placed.  Pt made aware.  No further questions or concerns voiced.

## 2013-11-11 ENCOUNTER — Telehealth: Payer: Self-pay | Admitting: Family Medicine

## 2013-11-11 NOTE — Telephone Encounter (Signed)
What type of US?  When is pt's GI appt?  If GI office feels they need an US, is this something that they can order (since I'm covering for PCP and not familiar w/ pt's situation)

## 2013-11-11 NOTE — Telephone Encounter (Signed)
Please advise 

## 2013-11-11 NOTE — Telephone Encounter (Addendum)
Patient called and stated that dr Matthias HughsBuccini nurse recommends Kelli Gould have an ultrasound done before being seen at GI. Please advise.

## 2013-11-12 NOTE — Telephone Encounter (Signed)
Pt stated that she was given the option of us sending her for the US or they could do it.  We asked that pt have them order and send patient for the ultrasound since Dr.  Laury AxonLowne is out of the office and Dr. Beverely Lowabori is not familiar with patient's case.  Pt stated understanding and stated that she would.

## 2013-11-23 ENCOUNTER — Other Ambulatory Visit: Payer: Self-pay | Admitting: Gastroenterology

## 2013-11-30 ENCOUNTER — Other Ambulatory Visit: Payer: Self-pay | Admitting: Gastroenterology

## 2013-11-30 DIAGNOSIS — R110 Nausea: Secondary | ICD-10-CM

## 2013-11-30 DIAGNOSIS — R109 Unspecified abdominal pain: Secondary | ICD-10-CM

## 2013-12-03 ENCOUNTER — Ambulatory Visit
Admission: RE | Admit: 2013-12-03 | Discharge: 2013-12-03 | Disposition: A | Discharge status: Home / Self Care | Payer: Managed Care, Other (non HMO) | Source: Ambulatory Visit | Attending: Gastroenterology | Admitting: Gastroenterology

## 2013-12-03 DIAGNOSIS — R109 Unspecified abdominal pain: Secondary | ICD-10-CM

## 2013-12-03 DIAGNOSIS — R110 Nausea: Secondary | ICD-10-CM

## 2013-12-03 MED ORDER — IOHEXOL 300 MG/ML  SOLN
100.0000 mL | Freq: Once | INTRAMUSCULAR | Status: AC | PRN
  Administered 2013-12-03: 100 mL via INTRAVENOUS

## 2013-12-16 ENCOUNTER — Other Ambulatory Visit: Payer: Self-pay

## 2013-12-16 MED ORDER — OMEPRAZOLE 40 MG PO CPDR: 40.0000 mg | DELAYED_RELEASE_CAPSULE | Freq: Every day | ORAL | Status: DC

## 2013-12-23 ENCOUNTER — Ambulatory Visit: Payer: Managed Care, Other (non HMO) | Admitting: Family Medicine

## 2014-06-22 ENCOUNTER — Encounter: Payer: Self-pay | Admitting: *Deleted

## 2014-06-22 ENCOUNTER — Telehealth: Payer: Self-pay | Admitting: *Deleted

## 2014-06-22 NOTE — Telephone Encounter (Signed)
Pre-Visit Call:   Reviewed allergies, medications, health history, and health maintenance with patient and made changes as appropriate.   Preferred pharmacy:  [id number] - [place], [place] - [address]  Pap- 10/16/12 with Dr Laury AxonLowne- normal [facility]- reported 03/06/10- normal [facility]- 03/20/11- BI-RADS CAT 1: [facility] (patient states she needs to schedule again) BD- reports 03/19/10- normal  Flu-03/06/14  Td- 07/18/11  Concerns: patient would like advice on losing weight

## 2014-06-23 ENCOUNTER — Encounter: Payer: Self-pay | Admitting: Family Medicine

## 2014-06-23 ENCOUNTER — Other Ambulatory Visit: Payer: Self-pay | Admitting: Family Medicine

## 2014-06-23 ENCOUNTER — Ambulatory Visit (INDEPENDENT_AMBULATORY_CARE_PROVIDER_SITE_OTHER): Payer: Managed Care, Other (non HMO) | Admitting: Family Medicine

## 2014-06-23 VITALS — BP 122/80 | HR 87 | Temp 98.1°F | Wt 213.8 lb

## 2014-06-23 DIAGNOSIS — Z1231 Encounter for screening mammogram for malignant neoplasm of breast: Secondary | ICD-10-CM

## 2014-06-23 DIAGNOSIS — E2839 Other primary ovarian failure: Secondary | ICD-10-CM

## 2014-06-23 DIAGNOSIS — F411 Generalized anxiety disorder: Secondary | ICD-10-CM

## 2014-06-23 DIAGNOSIS — M158 Other polyosteoarthritis: Secondary | ICD-10-CM

## 2014-06-23 DIAGNOSIS — Z0000 Encounter for general adult medical examination without abnormal findings: Secondary | ICD-10-CM

## 2014-06-23 MED ORDER — TRAMADOL HCL 50 MG PO TABS: 50.0000 mg | ORAL_TABLET | Freq: Four times a day (QID) | ORAL | Status: DC | PRN

## 2014-06-23 MED ORDER — ALPRAZOLAM 0.5 MG PO TABS: 0.5000 mg | ORAL_TABLET | Freq: Three times a day (TID) | ORAL | Status: DC | PRN

## 2014-06-23 MED ORDER — NALTREXONE-BUPROPION HCL ER 8-90 MG PO TB12: ORAL_TABLET | ORAL | Status: DC

## 2014-06-23 NOTE — Patient Instructions (Signed)
Preventive Care for Adults A healthy lifestyle and preventive care can promote health and wellness. Preventive health guidelines for women include the following key practices.  A routine yearly physical is a good way to check with your health care provider about your health and preventive screening. It is a chance to share any concerns and updates on your health and to receive a thorough exam.  Visit your dentist for a routine exam and preventive care every 6 months. Brush your teeth twice a day and floss once a day. Good oral hygiene prevents tooth decay and gum disease.  The frequency of eye exams is based on your age, health, family medical history, use of contact lenses, and other factors. Follow your health care provider's recommendations for frequency of eye exams.  Eat a healthy diet. Foods like vegetables, fruits, whole grains, low-fat dairy products, and lean protein foods contain the nutrients you need without too many calories. Decrease your intake of foods high in solid fats, added sugars, and salt. Eat the right amount of calories for you.Get information about a proper diet from your health care provider, if necessary.  Regular physical exercise is one of the most important things you can do for your health. Most adults should get at least 150 minutes of moderate-intensity exercise (any activity that increases your heart rate and causes you to sweat) each week. In addition, most adults need muscle-strengthening exercises on 2 or more days a week.  Maintain a healthy weight. The body mass index (BMI) is a screening tool to identify possible weight problems. It provides an estimate of body fat based on height and weight. Your health care provider can find your BMI and can help you achieve or maintain a healthy weight.For adults 20 years and older:  A BMI below 18.5 is considered underweight.  A BMI of 18.5 to 24.9 is normal.  A BMI of 25 to 29.9 is considered overweight.  A BMI of  30 and above is considered obese.  Maintain normal blood lipids and cholesterol levels by exercising and minimizing your intake of saturated fat. Eat a balanced diet with plenty of fruit and vegetables. Blood tests for lipids and cholesterol should begin at age 76 and be repeated every 5 years. If your lipid or cholesterol levels are high, you are over 50, or you are at high risk for heart disease, you may need your cholesterol levels checked more frequently.Ongoing high lipid and cholesterol levels should be treated with medicines if diet and exercise are not working.  If you smoke, find out from your health care provider how to quit. If you do not use tobacco, do not start.  Lung cancer screening is recommended for adults aged 22-80 years who are at high risk for developing lung cancer because of a history of smoking. A yearly low-dose CT scan of the lungs is recommended for people who have at least a 30-pack-year history of smoking and are a current smoker or have quit within the past 15 years. A pack year of smoking is smoking an average of 1 pack of cigarettes a day for 1 year (for example: 1 pack a day for 30 years or 2 packs a day for 15 years). Yearly screening should continue until the smoker has stopped smoking for at least 15 years. Yearly screening should be stopped for people who develop a health problem that would prevent them from having lung cancer treatment.  If you are pregnant, do not drink alcohol. If you are breastfeeding,  be very cautious about drinking alcohol. If you are not pregnant and choose to drink alcohol, do not have more than 1 drink per day. One drink is considered to be 12 ounces (355 mL) of beer, 5 ounces (148 mL) of wine, or 1.5 ounces (44 mL) of liquor.  Avoid use of street drugs. Do not share needles with anyone. Ask for help if you need support or instructions about stopping the use of drugs.  High blood pressure causes heart disease and increases the risk of  stroke. Your blood pressure should be checked at least every 1 to 2 years. Ongoing high blood pressure should be treated with medicines if weight loss and exercise do not work.  If you are 75-52 years old, ask your health care provider if you should take aspirin to prevent strokes.  Diabetes screening involves taking a blood sample to check your fasting blood sugar level. This should be done once every 3 years, after age 15, if you are within normal weight and without risk factors for diabetes. Testing should be considered at a younger age or be carried out more frequently if you are overweight and have at least 1 risk factor for diabetes.  Breast cancer screening is essential preventive care for women. You should practice "breast self-awareness." This means understanding the normal appearance and feel of your breasts and may include breast self-examination. Any changes detected, no matter how small, should be reported to a health care provider. Women in their 58s and 30s should have a clinical breast exam (CBE) by a health care provider as part of a regular health exam every 1 to 3 years. After age 16, women should have a CBE every year. Starting at age 53, women should consider having a mammogram (breast X-ray test) every year. Women who have a family history of breast cancer should talk to their health care provider about genetic screening. Women at a high risk of breast cancer should talk to their health care providers about having an MRI and a mammogram every year.  Breast cancer gene (BRCA)-related cancer risk assessment is recommended for women who have family members with BRCA-related cancers. BRCA-related cancers include breast, ovarian, tubal, and peritoneal cancers. Having family members with these cancers may be associated with an increased risk for harmful changes (mutations) in the breast cancer genes BRCA1 and BRCA2. Results of the assessment will determine the need for genetic counseling and  BRCA1 and BRCA2 testing.  Routine pelvic exams to screen for cancer are no longer recommended for nonpregnant women who are considered low risk for cancer of the pelvic organs (ovaries, uterus, and vagina) and who do not have symptoms. Ask your health care provider if a screening pelvic exam is right for you.  If you have had past treatment for cervical cancer or a condition that could lead to cancer, you need Pap tests and screening for cancer for at least 20 years after your treatment. If Pap tests have been discontinued, your risk factors (such as having a new sexual partner) need to be reassessed to determine if screening should be resumed. Some women have medical problems that increase the chance of getting cervical cancer. In these cases, your health care provider may recommend more frequent screening and Pap tests.  The HPV test is an additional test that may be used for cervical cancer screening. The HPV test looks for the virus that can cause the cell changes on the cervix. The cells collected during the Pap test can be  tested for HPV. The HPV test could be used to screen women aged 30 years and older, and should be used in women of any age who have unclear Pap test results. After the age of 30, women should have HPV testing at the same frequency as a Pap test.  Colorectal cancer can be detected and often prevented. Most routine colorectal cancer screening begins at the age of 50 years and continues through age 75 years. However, your health care provider may recommend screening at an earlier age if you have risk factors for colon cancer. On a yearly basis, your health care provider may provide home test kits to check for hidden blood in the stool. Use of a small camera at the end of a tube, to directly examine the colon (sigmoidoscopy or colonoscopy), can detect the earliest forms of colorectal cancer. Talk to your health care provider about this at age 50, when routine screening begins. Direct  exam of the colon should be repeated every 5-10 years through age 75 years, unless early forms of pre-cancerous polyps or small growths are found.  People who are at an increased risk for hepatitis B should be screened for this virus. You are considered at high risk for hepatitis B if:  You were born in a country where hepatitis B occurs often. Talk with your health care provider about which countries are considered high risk.  Your parents were born in a high-risk country and you have not received a shot to protect against hepatitis B (hepatitis B vaccine).  You have HIV or AIDS.  You use needles to inject street drugs.  You live with, or have sex with, someone who has hepatitis B.  You get hemodialysis treatment.  You take certain medicines for conditions like cancer, organ transplantation, and autoimmune conditions.  Hepatitis C blood testing is recommended for all people born from 1945 through 1965 and any individual with known risks for hepatitis C.  Practice safe sex. Use condoms and avoid high-risk sexual practices to reduce the spread of sexually transmitted infections (STIs). STIs include gonorrhea, chlamydia, syphilis, trichomonas, herpes, HPV, and human immunodeficiency virus (HIV). Herpes, HIV, and HPV are viral illnesses that have no cure. They can result in disability, cancer, and death.  You should be screened for sexually transmitted illnesses (STIs) including gonorrhea and chlamydia if:  You are sexually active and are younger than 24 years.  You are older than 24 years and your health care provider tells you that you are at risk for this type of infection.  Your sexual activity has changed since you were last screened and you are at an increased risk for chlamydia or gonorrhea. Ask your health care provider if you are at risk.  If you are at risk of being infected with HIV, it is recommended that you take a prescription medicine daily to prevent HIV infection. This is  called preexposure prophylaxis (PrEP). You are considered at risk if:  You are a heterosexual woman, are sexually active, and are at increased risk for HIV infection.  You take drugs by injection.  You are sexually active with a partner who has HIV.  Talk with your health care provider about whether you are at high risk of being infected with HIV. If you choose to begin PrEP, you should first be tested for HIV. You should then be tested every 3 months for as long as you are taking PrEP.  Osteoporosis is a disease in which the bones lose minerals and strength   with aging. This can result in serious bone fractures or breaks. The risk of osteoporosis can be identified using a bone density scan. Women ages 65 years and over and women at risk for fractures or osteoporosis should discuss screening with their health care providers. Ask your health care provider whether you should take a calcium supplement or vitamin D to reduce the rate of osteoporosis.  Menopause can be associated with physical symptoms and risks. Hormone replacement therapy is available to decrease symptoms and risks. You should talk to your health care provider about whether hormone replacement therapy is right for you.  Use sunscreen. Apply sunscreen liberally and repeatedly throughout the day. You should seek shade when your shadow is shorter than you. Protect yourself by wearing long sleeves, pants, a wide-brimmed hat, and sunglasses year round, whenever you are outdoors.  Once a month, do a whole body skin exam, using a mirror to look at the skin on your back. Tell your health care provider of new moles, moles that have irregular borders, moles that are larger than a pencil eraser, or moles that have changed in shape or color.  Stay current with required vaccines (immunizations).  Influenza vaccine. All adults should be immunized every year.  Tetanus, diphtheria, and acellular pertussis (Td, Tdap) vaccine. Pregnant women should  receive 1 dose of Tdap vaccine during each pregnancy. The dose should be obtained regardless of the length of time since the last dose. Immunization is preferred during the 27th-36th week of gestation. An adult who has not previously received Tdap or who does not know her vaccine status should receive 1 dose of Tdap. This initial dose should be followed by tetanus and diphtheria toxoids (Td) booster doses every 10 years. Adults with an unknown or incomplete history of completing a 3-dose immunization series with Td-containing vaccines should begin or complete a primary immunization series including a Tdap dose. Adults should receive a Td booster every 10 years.  Varicella vaccine. An adult without evidence of immunity to varicella should receive 2 doses or a second dose if she has previously received 1 dose. Pregnant females who do not have evidence of immunity should receive the first dose after pregnancy. This first dose should be obtained before leaving the health care facility. The second dose should be obtained 4-8 weeks after the first dose.  Human papillomavirus (HPV) vaccine. Females aged 13-26 years who have not received the vaccine previously should obtain the 3-dose series. The vaccine is not recommended for use in pregnant females. However, pregnancy testing is not needed before receiving a dose. If a female is found to be pregnant after receiving a dose, no treatment is needed. In that case, the remaining doses should be delayed until after the pregnancy. Immunization is recommended for any person with an immunocompromised condition through the age of 26 years if she did not get any or all doses earlier. During the 3-dose series, the second dose should be obtained 4-8 weeks after the first dose. The third dose should be obtained 24 weeks after the first dose and 16 weeks after the second dose.  Zoster vaccine. One dose is recommended for adults aged 60 years or older unless certain conditions are  present.  Measles, mumps, and rubella (MMR) vaccine. Adults born before 1957 generally are considered immune to measles and mumps. Adults born in 1957 or later should have 1 or more doses of MMR vaccine unless there is a contraindication to the vaccine or there is laboratory evidence of immunity to   each of the three diseases. A routine second dose of MMR vaccine should be obtained at least 28 days after the first dose for students attending postsecondary schools, health care workers, or international travelers. People who received inactivated measles vaccine or an unknown type of measles vaccine during 1963-1967 should receive 2 doses of MMR vaccine. People who received inactivated mumps vaccine or an unknown type of mumps vaccine before 1979 and are at high risk for mumps infection should consider immunization with 2 doses of MMR vaccine. For females of childbearing age, rubella immunity should be determined. If there is no evidence of immunity, females who are not pregnant should be vaccinated. If there is no evidence of immunity, females who are pregnant should delay immunization until after pregnancy. Unvaccinated health care workers born before 1957 who lack laboratory evidence of measles, mumps, or rubella immunity or laboratory confirmation of disease should consider measles and mumps immunization with 2 doses of MMR vaccine or rubella immunization with 1 dose of MMR vaccine.  Pneumococcal 13-valent conjugate (PCV13) vaccine. When indicated, a person who is uncertain of her immunization history and has no record of immunization should receive the PCV13 vaccine. An adult aged 19 years or older who has certain medical conditions and has not been previously immunized should receive 1 dose of PCV13 vaccine. This PCV13 should be followed with a dose of pneumococcal polysaccharide (PPSV23) vaccine. The PPSV23 vaccine dose should be obtained at least 8 weeks after the dose of PCV13 vaccine. An adult aged 19  years or older who has certain medical conditions and previously received 1 or more doses of PPSV23 vaccine should receive 1 dose of PCV13. The PCV13 vaccine dose should be obtained 1 or more years after the last PPSV23 vaccine dose.  Pneumococcal polysaccharide (PPSV23) vaccine. When PCV13 is also indicated, PCV13 should be obtained first. All adults aged 65 years and older should be immunized. An adult younger than age 65 years who has certain medical conditions should be immunized. Any person who resides in a nursing home or long-term care facility should be immunized. An adult smoker should be immunized. People with an immunocompromised condition and certain other conditions should receive both PCV13 and PPSV23 vaccines. People with human immunodeficiency virus (HIV) infection should be immunized as soon as possible after diagnosis. Immunization during chemotherapy or radiation therapy should be avoided. Routine use of PPSV23 vaccine is not recommended for American Indians, [place] Natives, or people younger than 65 years unless there are medical conditions that require PPSV23 vaccine. When indicated, people who have unknown immunization and have no record of immunization should receive PPSV23 vaccine. One-time revaccination 5 years after the first dose of PPSV23 is recommended for people aged 19-64 years who have chronic kidney failure, nephrotic syndrome, asplenia, or immunocompromised conditions. People who received 1-2 doses of PPSV23 before age 65 years should receive another dose of PPSV23 vaccine at age 65 years or later if at least 5 years have passed since the previous dose. Doses of PPSV23 are not needed for people immunized with PPSV23 at or after age 65 years.  Meningococcal vaccine. Adults with asplenia or persistent complement component deficiencies should receive 2 doses of quadrivalent meningococcal conjugate (MenACWY-D) vaccine. The doses should be obtained at least 2 months apart.  Microbiologists working with certain meningococcal bacteria, military recruits, people at risk during an outbreak, and people who travel to or live in countries with a high rate of meningitis should be immunized. A first-year college student up through age   21 years who is living in a residence hall should receive a dose if she did not receive a dose on or after her 16th birthday. Adults who have certain high-risk conditions should receive one or more doses of vaccine.  Hepatitis A vaccine. Adults who wish to be protected from this disease, have certain high-risk conditions, work with hepatitis A-infected animals, work in hepatitis A research labs, or travel to or work in countries with a high rate of hepatitis A should be immunized. Adults who were previously unvaccinated and who anticipate close contact with an international adoptee during the first 60 days after arrival in the [place] from a country with a high rate of hepatitis A should be immunized.  Hepatitis B vaccine. Adults who wish to be protected from this disease, have certain high-risk conditions, may be exposed to blood or other infectious body fluids, are household contacts or sex partners of hepatitis B positive people, are clients or workers in certain care facilities, or travel to or work in countries with a high rate of hepatitis B should be immunized.  Haemophilus influenzae type b (Hib) vaccine. A previously unvaccinated person with asplenia or sickle cell disease or having a scheduled splenectomy should receive 1 dose of Hib vaccine. Regardless of previous immunization, a recipient of a hematopoietic stem cell transplant should receive a 3-dose series 6-12 months after her successful transplant. Hib vaccine is not recommended for adults with HIV infection. Preventive Services / Frequency Ages 64 to 68 years  Blood pressure check.** / Every 1 to 2 years.  Lipid and cholesterol check.** / Every 5 years beginning at age  22.  Clinical breast exam.** / Every 3 years for women in their 88s and 53s.  BRCA-related cancer risk assessment.** / For women who have family members with a BRCA-related cancer (breast, ovarian, tubal, or peritoneal cancers).  Pap test.** / Every 2 years from ages 90 through 51. Every 3 years starting at age 21 through age 56 or 3 with a history of 3 consecutive normal Pap tests.  HPV screening.** / Every 3 years from ages 24 through ages 1 to 46 with a history of 3 consecutive normal Pap tests.  Hepatitis C blood test.** / For any individual with known risks for hepatitis C.  Skin self-exam. / Monthly.  Influenza vaccine. / Every year.  Tetanus, diphtheria, and acellular pertussis (Tdap, Td) vaccine.** / Consult your health care provider. Pregnant women should receive 1 dose of Tdap vaccine during each pregnancy. 1 dose of Td every 10 years.  Varicella vaccine.** / Consult your health care provider. Pregnant females who do not have evidence of immunity should receive the first dose after pregnancy.  HPV vaccine. / 3 doses over 6 months, if 72 and younger. The vaccine is not recommended for use in pregnant females. However, pregnancy testing is not needed before receiving a dose.  Measles, mumps, rubella (MMR) vaccine.** / You need at least 1 dose of MMR if you were born in 1957 or later. You may also need a 2nd dose. For females of childbearing age, rubella immunity should be determined. If there is no evidence of immunity, females who are not pregnant should be vaccinated. If there is no evidence of immunity, females who are pregnant should delay immunization until after pregnancy.  Pneumococcal 13-valent conjugate (PCV13) vaccine.** / Consult your health care provider.  Pneumococcal polysaccharide (PPSV23) vaccine.** / 1 to 2 doses if you smoke cigarettes or if you have certain conditions.  Meningococcal vaccine.** /  1 dose if you are age 19 to 21 years and a first-year college  student living in a residence hall, or have one of several medical conditions, you need to get vaccinated against meningococcal disease. You may also need additional booster doses.  Hepatitis A vaccine.** / Consult your health care provider.  Hepatitis B vaccine.** / Consult your health care provider.  Haemophilus influenzae type b (Hib) vaccine.** / Consult your health care provider. Ages 40 to 64 years  Blood pressure check.** / Every 1 to 2 years.  Lipid and cholesterol check.** / Every 5 years beginning at age 20 years.  Lung cancer screening. / Every year if you are aged 55-80 years and have a 30-pack-year history of smoking and currently smoke or have quit within the past 15 years. Yearly screening is stopped once you have quit smoking for at least 15 years or develop a health problem that would prevent you from having lung cancer treatment.  Clinical breast exam.** / Every year after age 40 years.  BRCA-related cancer risk assessment.** / For women who have family members with a BRCA-related cancer (breast, ovarian, tubal, or peritoneal cancers).  Mammogram.** / Every year beginning at age 40 years and continuing for as long as you are in good health. Consult with your health care provider.  Pap test.** / Every 3 years starting at age 30 years through age 65 or 70 years with a history of 3 consecutive normal Pap tests.  HPV screening.** / Every 3 years from ages 30 years through ages 65 to 70 years with a history of 3 consecutive normal Pap tests.  Fecal occult blood test (FOBT) of stool. / Every year beginning at age 50 years and continuing until age 75 years. You may not need to do this test if you get a colonoscopy every 10 years.  Flexible sigmoidoscopy or colonoscopy.** / Every 5 years for a flexible sigmoidoscopy or every 10 years for a colonoscopy beginning at age 50 years and continuing until age 75 years.  Hepatitis C blood test.** / For all people born from 1945 through  1965 and any individual with known risks for hepatitis C.  Skin self-exam. / Monthly.  Influenza vaccine. / Every year.  Tetanus, diphtheria, and acellular pertussis (Tdap/Td) vaccine.** / Consult your health care provider. Pregnant women should receive 1 dose of Tdap vaccine during each pregnancy. 1 dose of Td every 10 years.  Varicella vaccine.** / Consult your health care provider. Pregnant females who do not have evidence of immunity should receive the first dose after pregnancy.  Zoster vaccine.** / 1 dose for adults aged 60 years or older.  Measles, mumps, rubella (MMR) vaccine.** / You need at least 1 dose of MMR if you were born in 1957 or later. You may also need a 2nd dose. For females of childbearing age, rubella immunity should be determined. If there is no evidence of immunity, females who are not pregnant should be vaccinated. If there is no evidence of immunity, females who are pregnant should delay immunization until after pregnancy.  Pneumococcal 13-valent conjugate (PCV13) vaccine.** / Consult your health care provider.  Pneumococcal polysaccharide (PPSV23) vaccine.** / 1 to 2 doses if you smoke cigarettes or if you have certain conditions.  Meningococcal vaccine.** / Consult your health care provider.  Hepatitis A vaccine.** / Consult your health care provider.  Hepatitis B vaccine.** / Consult your health care provider.  Haemophilus influenzae type b (Hib) vaccine.** / Consult your health care provider. Ages 65   years and over  Blood pressure check.** / Every 1 to 2 years.  Lipid and cholesterol check.** / Every 5 years beginning at age 22 years.  Lung cancer screening. / Every year if you are aged 73-80 years and have a 30-pack-year history of smoking and currently smoke or have quit within the past 15 years. Yearly screening is stopped once you have quit smoking for at least 15 years or develop a health problem that would prevent you from having lung cancer  treatment.  Clinical breast exam.** / Every year after age 4 years.  BRCA-related cancer risk assessment.** / For women who have family members with a BRCA-related cancer (breast, ovarian, tubal, or peritoneal cancers).  Mammogram.** / Every year beginning at age 40 years and continuing for as long as you are in good health. Consult with your health care provider.  Pap test.** / Every 3 years starting at age 9 years through age 34 or 91 years with 3 consecutive normal Pap tests. Testing can be stopped between 65 and 70 years with 3 consecutive normal Pap tests and no abnormal Pap or HPV tests in the past 10 years.  HPV screening.** / Every 3 years from ages 57 years through ages 64 or 45 years with a history of 3 consecutive normal Pap tests. Testing can be stopped between 65 and 70 years with 3 consecutive normal Pap tests and no abnormal Pap or HPV tests in the past 10 years.  Fecal occult blood test (FOBT) of stool. / Every year beginning at age 15 years and continuing until age 17 years. You may not need to do this test if you get a colonoscopy every 10 years.  Flexible sigmoidoscopy or colonoscopy.** / Every 5 years for a flexible sigmoidoscopy or every 10 years for a colonoscopy beginning at age 86 years and continuing until age 71 years.  Hepatitis C blood test.** / For all people born from [birth year] through 1965 and any individual with known risks for hepatitis C.  Osteoporosis screening.** / A one-time screening for women ages 83 years and over and women at risk for fractures or osteoporosis.  Skin self-exam. / Monthly.  Influenza vaccine. / Every year.  Tetanus, diphtheria, and acellular pertussis (Tdap/Td) vaccine.** / 1 dose of Td every 10 years.  Varicella vaccine.** / Consult your health care provider.  Zoster vaccine.** / 1 dose for adults aged 61 years or older.  Pneumococcal 13-valent conjugate (PCV13) vaccine.** / Consult your health care provider.  Pneumococcal  polysaccharide (PPSV23) vaccine.** / 1 dose for all adults aged 28 years and older.  Meningococcal vaccine.** / Consult your health care provider.  Hepatitis A vaccine.** / Consult your health care provider.  Hepatitis B vaccine.** / Consult your health care provider.  Haemophilus influenzae type b (Hib) vaccine.** / Consult your health care provider. ** Family history and personal history of risk and conditions may change your health care provider's recommendations. Document Released: 06/18/2001 Document Revised: 09/06/2013 Document Reviewed: 09/17/2010 [facility] Patient Information 2015 [place], [place]. This information is not intended to replace advice given to you by your health care provider. Make sure you discuss any questions you have with your health care provider.

## 2014-06-23 NOTE — Progress Notes (Signed)
Subjective:     Kelli Gould is a 59 y.o. female and is here for a comprehensive physical exam. The patient reports no problems.  History   Social History  . Marital Status: Married    Spouse Name: N/A  . Number of Children: N/A  . Years of Education: N/A   Occupational History  . Not on file.   Social History Main Topics  . Smoking status: Never Smoker   . Smokeless tobacco: Never Used  . Alcohol Use: No  . Drug Use: No  . Sexual Activity:    Partners: Male   Other Topics Concern  . Not on file   Social History Narrative   Exercise--walk---not enough per pt   Health Maintenance  Topic Date Due  . MAMMOGRAM  09/21/2014 (Originally 03/19/2013)  . INFLUENZA VACCINE  12/05/2014  . PAP SMEAR  10/17/2015  . COLONOSCOPY  03/06/2020  . TETANUS/TDAP  07/17/2021    The following portions of the patient's history were reviewed and updated as appropriate:  She  has a past medical history of Anxiety. She  does not have any pertinent problems on file. She  has past surgical history that includes Cholecystectomy (1994); Appendectomy (1995); Back surgery; Spine surgery (4098,11,91(2004,05,09); and Vaginal hysterectomy (1994). Her family history includes Breast cancer in her maternal aunt; Diabetes (age of onset: 3175) in her father; Heart disease (age of onset: 3965) in her mother; Hypertension (age of onset: 7165) in her mother. She  reports that she has never smoked. She has never used smokeless tobacco. She reports that she does not drink alcohol or use illicit drugs. She has a current medication list which includes the following prescription(s): alprazolam, multivitamin, and tramadol. Current Outpatient Prescriptions on File Prior to Visit  Medication Sig Dispense Refill  . Multiple Vitamin (MULTIVITAMIN) tablet Take 1 tablet by mouth daily.     No current facility-administered medications on file prior to visit.   She has No Known Allergies..  Review of Systems Review of Systems   Constitutional: Negative for activity change, appetite change and fatigue.  HENT: Negative for hearing loss, congestion, tinnitus and ear discharge.  dentist [id number] Eyes: Negative for visual disturbance (see optho q2y -- vision corrected to 20/20 with glasses).  Respiratory: Negative for cough, chest tightness and shortness of breath.   Cardiovascular: Negative for chest pain, palpitations and leg swelling.  Gastrointestinal: Negative for abdominal pain, diarrhea, constipation and abdominal distention.  Genitourinary: Negative for urgency, frequency, decreased urine volume and difficulty urinating.  Musculoskeletal: Negative for back pain, arthralgias and gait problem.  Skin: Negative for color change, pallor and rash.  Neurological: Negative for dizziness, light-headedness, numbness and headaches.  Hematological: Negative for adenopathy. Does not bruise/bleed easily.  Psychiatric/Behavioral: Negative for suicidal ideas, confusion, sleep disturbance, self-injury, dysphoric mood, decreased concentration and agitation.       Objective:    BP 122/80 mmHg  Pulse 87  Temp(Src) 98.1 F (36.7 C) (Oral)  Wt 213 lb 12.8 oz (96.979 kg)  SpO2 97% General appearance: alert, cooperative, appears stated age and no distress Head: Normocephalic, without obvious abnormality, atraumatic Eyes: conjunctivae/corneas clear. PERRL, EOM's intact. Fundi benign. Ears: normal TM's and external ear canals both ears Nose: Nares normal. Septum midline. Mucosa normal. No drainage or sinus tenderness. Throat: lips, mucosa, and tongue normal; teeth and gums normal Neck: no adenopathy, no carotid bruit, no JVD, supple, symmetrical, trachea midline and thyroid not enlarged, symmetric, no tenderness/mass/nodules Back: symmetric, no curvature. ROM normal. No CVA tenderness.  Lungs: clear to auscultation bilaterally Breasts: normal appearance, no masses or tenderness Heart: regular rate and rhythm, S1, S2 normal, no  murmur, click, rub or gallop Abdomen: soft, non-tender; bowel sounds normal; no masses,  no organomegaly Pelvic: deferred--gyn Extremities: extremities normal, atraumatic, no cyanosis or edema Pulses: 2+ and symmetric Skin: Skin color, texture, turgor normal. No rashes or lesions Lymph nodes: Cervical, supraclavicular, and axillary nodes normal. Neurologic: Alert and oriented X 3, normal strength and tone. Normal symmetric reflexes. Normal coordination and gait Psych-- no depression, anxiety      Assessment:    Healthy female exam.      Plan:    ghm utd Check labs See After Visit Summary for Counseling Recommendations  . 1. Estrogen deficiency  - DG Bone Density; Future  2. Generalized anxiety disorder  - ALPRAZolam (XANAX) 0.5 MG tablet; Take 1 tablet (0.5 mg total) by mouth 3 (three) times daily as needed.  Dispense: 60 tablet; Refill: 0  3. Other osteoarthritis involving multiple joints  - traMADol (ULTRAM) 50 MG tablet; Take 1 tablet (50 mg total) by mouth every 6 (six) hours as needed.  Dispense: 30 tablet; Refill: 0  4. Preventative health care  - Basic metabolic panel; Future - CBC with Differential/Platelet; Future - Hepatic function panel; Future - Lipid panel; Future - POCT urinalysis dipstick; Future - TSH; Future  5. Morbid obesity D/w pt diet and exercise - Naltrexone-Bupropion HCl ER (CONTRAVE) 8-90 MG TB12; 2 po bid  Dispense: 120 tablet; Refill: 5

## 2014-06-23 NOTE — Progress Notes (Signed)
Pre visit review using our clinic review tool, if applicable. No additional management support is needed unless otherwise documented below in the visit note. 

## 2014-06-24 ENCOUNTER — Other Ambulatory Visit (INDEPENDENT_AMBULATORY_CARE_PROVIDER_SITE_OTHER): Payer: Managed Care, Other (non HMO)

## 2014-06-24 DIAGNOSIS — Z0000 Encounter for general adult medical examination without abnormal findings: Secondary | ICD-10-CM

## 2014-06-24 LAB — CBC WITH DIFFERENTIAL/PLATELET
Basophils Absolute: 0 10*3/uL (ref 0.0–0.1)
Basophils Relative: 0.5 % (ref 0.0–3.0)
Eosinophils Absolute: 0.1 10*3/uL (ref 0.0–0.7)
Eosinophils Relative: 1.1 % (ref 0.0–5.0)
HCT: 41.6 % (ref 36.0–46.0)
Hemoglobin: 14.3 g/dL (ref 12.0–15.0)
LYMPHS ABS: 2.4 10*3/uL (ref 0.7–4.0)
Lymphocytes Relative: 26.6 % (ref 12.0–46.0)
MCHC: 34.3 g/dL (ref 30.0–36.0)
MCV: 92.4 fl (ref 78.0–100.0)
Monocytes Absolute: 0.5 10*3/uL (ref 0.1–1.0)
Monocytes Relative: 5.9 % (ref 3.0–12.0)
Neutro Abs: 5.9 10*3/uL (ref 1.4–7.7)
Neutrophils Relative %: 65.9 % (ref 43.0–77.0)
PLATELETS: 306 10*3/uL (ref 150.0–400.0)
RBC: 4.51 Mil/uL (ref 3.87–5.11)
RDW: 12.4 % (ref 11.5–15.5)
WBC: 9 10*3/uL (ref 4.0–10.5)

## 2014-06-24 LAB — LIPID PANEL
CHOL/HDL RATIO: 5
Cholesterol: 207 mg/dL — ABNORMAL HIGH (ref 0–200)
HDL: 39.2 mg/dL (ref >39.00)
LDL CALC: 138 mg/dL — AB (ref 0–99)
NonHDL: 167.8
Triglycerides: 148 mg/dL (ref 0.0–149.0)
VLDL: 29.6 mg/dL (ref 0.0–40.0)

## 2014-06-24 LAB — BASIC METABOLIC PANEL
BUN: 15 mg/dL (ref 6–23)
CALCIUM: 9.8 mg/dL (ref 8.4–10.5)
CO2: 26 mEq/L (ref 19–32)
Chloride: 107 mEq/L (ref 96–112)
Creatinine, Ser: 0.85 mg/dL (ref 0.40–1.20)
GFR: 72.96 mL/min (ref >60.00)
GLUCOSE: 99 mg/dL (ref 70–99)
Potassium: 3.9 mEq/L (ref 3.5–5.1)
Sodium: 140 mEq/L (ref 135–145)

## 2014-06-24 LAB — HEPATIC FUNCTION PANEL
ALBUMIN: 4.1 g/dL (ref 3.5–5.2)
ALT: 22 U/L (ref 0–35)
AST: 24 U/L (ref 0–37)
Alkaline Phosphatase: 148 U/L — ABNORMAL HIGH (ref 39–117)
Bilirubin, Direct: 0.1 mg/dL (ref 0.0–0.3)
TOTAL PROTEIN: 7.6 g/dL (ref 6.0–8.3)
Total Bilirubin: 0.4 mg/dL (ref 0.2–1.2)

## 2014-06-24 LAB — TSH: TSH: 1.26 u[IU]/mL (ref 0.35–4.50)

## 2014-06-30 ENCOUNTER — Ambulatory Visit
Admission: RE | Admit: 2014-06-30 | Discharge: 2014-06-30 | Disposition: A | Discharge status: Home / Self Care | Payer: Managed Care, Other (non HMO) | Source: Ambulatory Visit | Attending: Family Medicine | Admitting: Family Medicine

## 2014-06-30 DIAGNOSIS — Z1231 Encounter for screening mammogram for malignant neoplasm of breast: Secondary | ICD-10-CM

## 2014-06-30 DIAGNOSIS — E2839 Other primary ovarian failure: Secondary | ICD-10-CM

## 2014-07-04 ENCOUNTER — Telehealth: Payer: Self-pay

## 2014-07-04 DIAGNOSIS — R945 Abnormal results of liver function studies: Principal | ICD-10-CM

## 2014-07-04 DIAGNOSIS — R7989 Other specified abnormal findings of blood chemistry: Secondary | ICD-10-CM

## 2014-07-04 NOTE — Telephone Encounter (Signed)
-----  Message from Rosalita Chessman, DO sent at 06/30/2014  9:37 PM EST ----- Cholesterol--- LDL goal < 100,  HDL >40,  TG < 150.  Diet and exercise will increase HDL and decrease LDL and TG.  Fish,  Fish Oil, Flaxseed oil will also help increase the HDL and decrease Triglycerides.   Recheck labs in 3 months----lipid, hep--hyperlipidemia Alk phos is high--- (liver function)----check vita D, ggt, hep---2-3 weeks.

## 2014-07-04 NOTE — Telephone Encounter (Signed)
Discussed with patient and she verbalized understanding. She has been scheduled for the follow up. Lab orders have been placed.       KP

## 2014-07-18 ENCOUNTER — Other Ambulatory Visit (INDEPENDENT_AMBULATORY_CARE_PROVIDER_SITE_OTHER): Payer: Managed Care, Other (non HMO)

## 2014-07-18 DIAGNOSIS — R945 Abnormal results of liver function studies: Principal | ICD-10-CM

## 2014-07-18 DIAGNOSIS — R7989 Other specified abnormal findings of blood chemistry: Secondary | ICD-10-CM

## 2014-07-18 LAB — HEPATIC FUNCTION PANEL
ALK PHOS: 141 U/L — AB (ref 39–117)
ALT: 16 U/L (ref 0–35)
AST: 21 U/L (ref 0–37)
Albumin: 4.2 g/dL (ref 3.5–5.2)
BILIRUBIN TOTAL: 0.4 mg/dL (ref 0.2–1.2)
Bilirubin, Direct: 0.1 mg/dL (ref 0.0–0.3)
Total Protein: 7.4 g/dL (ref 6.0–8.3)

## 2014-07-18 LAB — GAMMA GT: GGT: 15 U/L (ref 7–51)

## 2014-07-21 LAB — VITAMIN D 1,25 DIHYDROXY
VITAMIN D 1, 25 (OH) TOTAL: 57 pg/mL (ref 18–72)
VITAMIN D3 1, 25 (OH): 57 pg/mL
Vitamin D2 1, 25 (OH)2: <8 pg/mL

## 2015-03-07 ENCOUNTER — Other Ambulatory Visit: Payer: Self-pay | Admitting: Family Medicine

## 2015-03-09 NOTE — Telephone Encounter (Signed)
Last seen and filled 06/23/14 #60   Please advise       KP

## 2015-03-20 ENCOUNTER — Ambulatory Visit (INDEPENDENT_AMBULATORY_CARE_PROVIDER_SITE_OTHER): Payer: Managed Care, Other (non HMO) | Admitting: Family Medicine

## 2015-03-20 ENCOUNTER — Encounter: Payer: Self-pay | Admitting: Family Medicine

## 2015-03-20 VITALS — BP 145/78 | HR 76 | Temp 97.7°F | Wt 211.6 lb

## 2015-03-20 DIAGNOSIS — F419 Anxiety disorder, unspecified: Secondary | ICD-10-CM

## 2015-03-20 DIAGNOSIS — F411 Generalized anxiety disorder: Secondary | ICD-10-CM | POA: Diagnosis not present

## 2015-03-20 MED ORDER — ESCITALOPRAM OXALATE 10 MG PO TABS: 10.0000 mg | ORAL_TABLET | Freq: Every day | ORAL | Status: DC

## 2015-03-20 NOTE — Assessment & Plan Note (Signed)
Start lexapro and cont prn xanax rto 1 month

## 2015-03-20 NOTE — Progress Notes (Signed)
Patient ID: Kelli Gould, female    DOB: [birth year]  Age: 59 y.o. MRN: [id number]    Subjective:  Subjective HPI Kelli Gould presents for c/o anxiety --- pt is taking care of 2 family members in [place].  She has been going to NH daily.    Review of Systems  Constitutional: Negative for diaphoresis, appetite change, fatigue and unexpected weight change.  Eyes: Negative for pain, redness and visual disturbance.  Respiratory: Negative for cough, chest tightness, shortness of breath and wheezing.   Cardiovascular: Negative for chest pain, palpitations and leg swelling.  Endocrine: Negative for cold intolerance, heat intolerance, polydipsia, polyphagia and polyuria.  Genitourinary: Negative for dysuria, frequency and difficulty urinating.  Neurological: Negative for dizziness, light-headedness, numbness and headaches.  Psychiatric/Behavioral: Positive for sleep disturbance. Negative for suicidal ideas, self-injury and dysphoric mood. The patient is nervous/anxious.     History Past Medical History  Diagnosis Date  . Anxiety     She has past surgical history that includes Cholecystectomy (1994); Appendectomy (1995); Back surgery; Spine surgery (9562,13,08); and Vaginal hysterectomy (1994).   Her family history includes Breast cancer in her maternal aunt; Diabetes (age of onset: 95) in her father; Heart disease (age of onset: 51) in her mother; Hypertension (age of onset: 74) in her mother.She reports that she has never smoked. She has never used smokeless tobacco. She reports that she does not drink alcohol or use illicit drugs.  Current Outpatient Prescriptions on File Prior to Visit  Medication Sig Dispense Refill  . ALPRAZolam (XANAX) 0.5 MG tablet TAKE 1 TABLET BY MOUTH 3 TIMES A DAY AS NEEDED 60 tablet 1  . Multiple Vitamin (MULTIVITAMIN) tablet Take 1 tablet by mouth daily.    . traMADol (ULTRAM) 50 MG tablet Take 1 tablet (50 mg total) by mouth every 6 (six) hours as needed. 30  tablet 0   No current facility-administered medications on file prior to visit.     Objective:  Objective Physical Exam  Constitutional: She is oriented to person, place, and time. She appears well-developed and well-nourished.  HENT:  Head: Normocephalic and atraumatic.  Eyes: Conjunctivae and EOM are normal.  Neck: Normal range of motion. Neck supple. No JVD present. Carotid bruit is not present. No thyromegaly present.  Cardiovascular: Normal rate, regular rhythm and normal heart sounds.   No murmur heard. Pulmonary/Chest: Effort normal and breath sounds normal. No respiratory distress. She has no wheezes. She has no rales. She exhibits no tenderness.  Musculoskeletal: She exhibits no edema.  Neurological: She is alert and oriented to person, place, and time.  Psychiatric: She has a normal mood and affect. Her behavior is normal. Judgment and thought content normal.  Nursing note and vitals reviewed.  BP 145/78 mmHg  Pulse 76  Temp(Src) 97.7 F (36.5 C) (Oral)  Wt 211 lb 9.6 oz (95.981 kg)  SpO2 99% Wt Readings from Last 3 Encounters:  03/20/15 211 lb 9.6 oz (95.981 kg)  06/23/14 213 lb 12.8 oz (96.979 kg)  10/18/13 209 lb (94.802 kg)     Lab Results  Component Value Date   WBC 9.0 06/24/2014   HGB 14.3 06/24/2014   HCT 41.6 06/24/2014   PLT 306.0 06/24/2014   GLUCOSE 99 06/24/2014   CHOL 207* 06/24/2014   TRIG 148.0 06/24/2014   HDL 39.20 06/24/2014   LDLDIRECT 138.8 10/16/2012   LDLCALC 138* 06/24/2014   ALT 16 07/18/2014   AST 21 07/18/2014   NA 140 06/24/2014   K 3.9  06/24/2014   CL 107 06/24/2014   CREATININE 0.85 06/24/2014   BUN 15 06/24/2014   CO2 26 06/24/2014   TSH 1.26 06/24/2014   INR 0.9 05/08/2007    Dg Bone Density  07/01/2014  CLINICAL DATA:  Postmenopausal. EXAM: DUAL X-RAY ABSORPTIOMETRY (DXA) FOR BONE MINERAL DENSITY FINDINGS: AP LUMBAR SPINE (L1-L2) Bone Mineral Density (BMD):  1.149 g/cm2 Young Adult T-Score:  1.5 Z-Score:  2.7 LEFT  FEMUR NECK Bone Mineral Density (BMD):  0.846 g/cm2 Young Adult T-Score: 0.0 Z-Score:  1.2 ASSESSMENT: Patient's diagnostic category is NORMAL by WHO Criteria. FRACTURE RISK: NOT INCREASED FRAX: World Health Organization FRAX assessment of absolute fracture risk is not calculated for this patient because the patient has all T-scores at or above -1.0. COMPARISON: None. Effective therapies are available in the form of bisphosphonates, selective estrogen receptor modulators, biologic agents, and hormone replacement therapy (for women). All patients should ensure an adequate intake of dietary calcium (1200 mg daily) and vitamin D (800 IU daily) unless contraindicated. All treatment decisions require clinical judgment and consideration of individual patient factors, including patient preferences, co-morbidities, previous drug use, risk factors not captured in the FRAX model (e.g., frailty, falls, vitamin D deficiency, increased bone turnover, interval significant decline in bone density) and possible under- or over-estimation of fracture risk by FRAX. The National Osteoporosis Foundation recommends that FDA-approved medical therapies be considered in postmenopausal women and men age 59 or older with a: 1. Hip or vertebral (clinical or morphometric) fracture. 2. T-score of -2.5 or lower at the spine or hip. 3. Ten-year fracture probability by FRAX of 3% or greater for hip fracture or 20% or greater for major osteoporotic fracture. People with diagnosed cases of osteoporosis or at high risk for fracture should have regular bone mineral density tests. For patients eligible for Medicare, routine testing is allowed once every 2 years. The testing frequency can be increased to one year for patients who have rapidly progressing disease, those who are receiving or discontinuing medical therapy to restore bone mass, or have additional risk factors. World Science writerHealth Organization [employer](WHO) Criteria: Normal: T-scores from +1.0 to -1.0 Low  Bone Mass (Osteopenia): T-scores between -1.0 and -2.5 Osteoporosis: T-scores -2.5 and below Comparison to Reference Population: T-score is the key measure used in the diagnosis of osteoporosis and relative risk determination for fracture. It provides a value for bone mass relative to the mean bone mass of a young adult reference population expressed in terms of standard deviation (SD). Z-score is the age-matched score showing the patient's values compared to a population matched for age, sex, and race. This is also expressed in terms of standard deviation. The patient may have values that compare favorably to the age-matched values and still be at increased risk for fracture. Electronically Signed   By: Baird Lyonsina  Arceo M.D.   On: 07/01/2014 15:23   Mm Screening Breast Tomo Bilateral  07/01/2014  CLINICAL DATA:  Screening. EXAM: DIGITAL SCREENING BILATERAL MAMMOGRAM WITH 3D TOMO WITH CAD COMPARISON:  Previous exam(s). ACR Breast Density Category c: The breast tissue is heterogeneously dense, which may obscure small masses. FINDINGS: There are no findings suspicious for malignancy. Images were processed with CAD. IMPRESSION: No mammographic evidence of malignancy. A result letter of this screening mammogram will be mailed directly to the patient. RECOMMENDATION: Screening mammogram in one year. (Code:SM-B-01Y) BI-RADS CATEGORY  1: Negative. Electronically Signed   By: Amie Portlandavid  Ormond M.D.   On: 07/01/2014 13:12     Assessment & Plan:  Plan  I have discontinued Ms. Dimmer's Naltrexone-Bupropion HCl ER. I am also having her start on escitalopram. Additionally, I am having her maintain her multivitamin, traMADol, and ALPRAZolam.  Meds ordered this encounter  Medications  . escitalopram (LEXAPRO) 10 MG tablet    Sig: Take 1 tablet (10 mg total) by mouth daily.    Dispense:  30 tablet    Refill:  2    Problem List Items Addressed This Visit    Anxiety    Start lexapro and cont prn xanax rto 1 month       Relevant Medications   escitalopram (LEXAPRO) 10 MG tablet    Other Visit Diagnoses    Generalized anxiety disorder    -  Primary    Relevant Medications    escitalopram (LEXAPRO) 10 MG tablet       Follow-up: Return in about 4 weeks (around 04/17/2015), or if symptoms worsen or fail to improve.  Loreen Freud, DO

## 2015-03-20 NOTE — Patient Instructions (Signed)
Generalized Anxiety Disorder Generalized anxiety disorder (GAD) is a mental disorder. It interferes with life functions, including relationships, work, and school. GAD is different from normal anxiety, which everyone experiences at some point in their lives in response to specific life events and activities. Normal anxiety actually helps us prepare for and get through these life events and activities. Normal anxiety goes away after the event or activity is over.  GAD causes anxiety that is not necessarily related to specific events or activities. It also causes excess anxiety in proportion to specific events or activities. The anxiety associated with GAD is also difficult to control. GAD can vary from mild to severe. People with severe GAD can have intense waves of anxiety with physical symptoms (panic attacks).  SYMPTOMS The anxiety and worry associated with GAD are difficult to control. This anxiety and worry are related to many life events and activities and also occur more days than not for 6 months or longer. People with GAD also have three or more of the following symptoms (one or more in children):  Restlessness.   Fatigue.  Difficulty concentrating.   Irritability.  Muscle tension.  Difficulty sleeping or unsatisfying sleep. DIAGNOSIS GAD is diagnosed through an assessment by your health care provider. Your health care provider will ask you questions aboutyour mood,physical symptoms, and events in your life. Your health care provider may ask you about your medical history and use of alcohol or drugs, including prescription medicines. Your health care provider may also do a physical exam and blood tests. Certain medical conditions and the use of certain substances can cause symptoms similar to those associated with GAD. Your health care provider may refer you to a mental health specialist for further evaluation. TREATMENT The following therapies are usually used to treat GAD:    Medication. Antidepressant medication usually is prescribed for long-term daily control. Antianxiety medicines may be added in severe cases, especially when panic attacks occur.   Talk therapy (psychotherapy). Certain types of talk therapy can be helpful in treating GAD by providing support, education, and guidance. A form of talk therapy called cognitive behavioral therapy can teach you healthy ways to think about and react to daily life events and activities.  Stress managementtechniques. These include yoga, meditation, and exercise and can be very helpful when they are practiced regularly. A mental health specialist can help determine which treatment is best for you. Some people see improvement with one therapy. However, other people require a combination of therapies.   This information is not intended to replace advice given to you by your health care provider. Make sure you discuss any questions you have with your health care provider.   Document Released: 08/17/2012 Document Revised: 05/13/2014 Document Reviewed: 08/17/2012 [employer] Interactive Patient Education 2016 [employer]  

## 2015-03-20 NOTE — Progress Notes (Signed)
Pre visit review using our clinic review tool, if applicable. No additional management support is needed unless otherwise documented below in the visit note. 

## 2015-04-18 ENCOUNTER — Encounter: Payer: Self-pay | Admitting: Family Medicine

## 2015-04-18 ENCOUNTER — Ambulatory Visit (INDEPENDENT_AMBULATORY_CARE_PROVIDER_SITE_OTHER): Payer: Managed Care, Other (non HMO) | Admitting: Family Medicine

## 2015-04-18 VITALS — BP 132/78 | HR 66 | Temp 98.5°F | Ht 64.0 in | Wt 210.2 lb

## 2015-04-18 DIAGNOSIS — K219 Gastro-esophageal reflux disease without esophagitis: Secondary | ICD-10-CM

## 2015-04-18 DIAGNOSIS — F419 Anxiety disorder, unspecified: Secondary | ICD-10-CM | POA: Diagnosis not present

## 2015-04-18 DIAGNOSIS — R1013 Epigastric pain: Secondary | ICD-10-CM | POA: Diagnosis not present

## 2015-04-18 MED ORDER — OMEPRAZOLE 20 MG PO CPDR: 20.0000 mg | DELAYED_RELEASE_CAPSULE | Freq: Every day | ORAL | Status: DC

## 2015-04-18 NOTE — Progress Notes (Signed)
Patient ID: Kelli Gould, female    DOB: 12/14/1955  Age: 59 y.o. MRN: [id number]    Subjective:  Subjective HPI Kelli Gould presents for f/u anxiety.  She feels she is doing better but she has been having stomach trouble with eating.    Review of Systems  Constitutional: Negative for diaphoresis, appetite change, fatigue and unexpected weight change.  Eyes: Negative for pain, redness and visual disturbance.  Respiratory: Negative for cough, chest tightness, shortness of breath and wheezing.   Cardiovascular: Negative for chest pain, palpitations and leg swelling.  Gastrointestinal: Positive for abdominal pain.  Endocrine: Negative for cold intolerance, heat intolerance, polydipsia, polyphagia and polyuria.  Genitourinary: Negative for dysuria, frequency and difficulty urinating.  Neurological: Negative for dizziness, light-headedness, numbness and headaches.  Psychiatric/Behavioral: Positive for sleep disturbance and dysphoric mood. The patient is nervous/anxious.     History Past Medical History  Diagnosis Date  . Anxiety     She has past surgical history that includes Cholecystectomy (1994); Appendectomy (1995); Back surgery; Spine surgery (5320,23,34); and Vaginal hysterectomy (1994).   Her family history includes Breast cancer in her maternal aunt; Diabetes (age of onset: 38) in her father; Heart disease (age of onset: 30) in her mother; Hypertension (age of onset: 6) in her mother.She reports that she has never smoked. She has never used smokeless tobacco. She reports that she does not drink alcohol or use illicit drugs.  Current Outpatient Prescriptions on File Prior to Visit  Medication Sig Dispense Refill  . ALPRAZolam (XANAX) 0.5 MG tablet TAKE 1 TABLET BY MOUTH 3 TIMES A DAY AS NEEDED 60 tablet 1  . escitalopram (LEXAPRO) 10 MG tablet Take 1 tablet (10 mg total) by mouth daily. 30 tablet 2  . Multiple Vitamin (MULTIVITAMIN) tablet Take 1 tablet by mouth daily.      . traMADol (ULTRAM) 50 MG tablet Take 1 tablet (50 mg total) by mouth every 6 (six) hours as needed. 30 tablet 0   No current facility-administered medications on file prior to visit.     Objective:  Objective Physical Exam  Constitutional: She is oriented to person, place, and time. She appears well-developed and well-nourished.  HENT:  Head: Normocephalic and atraumatic.  Eyes: Conjunctivae and EOM are normal.  Neck: Normal range of motion. Neck supple. No JVD present. Carotid bruit is not present. No thyromegaly present.  Cardiovascular: Normal rate, regular rhythm and normal heart sounds.   No murmur heard. Pulmonary/Chest: Effort normal and breath sounds normal. No respiratory distress. She has no wheezes. She has no rales. She exhibits no tenderness.  Abdominal: Soft. There is tenderness in the epigastric area. There is no rigidity, no rebound, no guarding, no tenderness at McBurney's point and negative Murphy's sign.    Musculoskeletal: She exhibits no edema.  Neurological: She is alert and oriented to person, place, and time.  Psychiatric: She has a normal mood and affect.  Nursing note and vitals reviewed.  BP 132/78 mmHg  Pulse 66  Temp(Src) 98.5 F (36.9 C) (Oral)  Ht 5' 4"  (1.626 m)  Wt 210 lb 3.2 oz (95.346 kg)  BMI 36.06 kg/m2  SpO2 95% Wt Readings from Last 3 Encounters:  04/18/15 210 lb 3.2 oz (95.346 kg)  03/20/15 211 lb 9.6 oz (95.981 kg)  06/23/14 213 lb 12.8 oz (96.979 kg)     Lab Results  Component Value Date   WBC 9.0 06/24/2014   HGB 14.3 06/24/2014   HCT 41.6 06/24/2014   PLT 306.0  06/24/2014   GLUCOSE 99 06/24/2014   CHOL 207* 06/24/2014   TRIG 148.0 06/24/2014   HDL 39.20 06/24/2014   LDLDIRECT 138.8 10/16/2012   LDLCALC 138* 06/24/2014   ALT 16 07/18/2014   AST 21 07/18/2014   NA 140 06/24/2014   K 3.9 06/24/2014   CL 107 06/24/2014   CREATININE 0.85 06/24/2014   BUN 15 06/24/2014   CO2 26 06/24/2014   TSH 1.26 06/24/2014   INR  0.9 05/08/2007    Dg Bone Density  07/01/2014  CLINICAL DATA:  Postmenopausal. EXAM: DUAL X-RAY ABSORPTIOMETRY (DXA) FOR BONE MINERAL DENSITY FINDINGS: AP LUMBAR SPINE (L1-L2) Bone Mineral Density (BMD):  1.149 g/cm2 Young Adult T-Score:  1.5 Z-Score:  2.7 LEFT FEMUR NECK Bone Mineral Density (BMD):  0.846 g/cm2 Young Adult T-Score: 0.0 Z-Score:  1.2 ASSESSMENT: Patient's diagnostic category is NORMAL by WHO Criteria. FRACTURE RISK: NOT INCREASED FRAX: World Health Organization FRAX assessment of absolute fracture risk is not calculated for this patient because the patient has all T-scores at or above -1.0. COMPARISON: None. Effective therapies are available in the form of bisphosphonates, selective estrogen receptor modulators, biologic agents, and hormone replacement therapy (for women). All patients should ensure an adequate intake of dietary calcium (1200 mg daily) and vitamin D (800 IU daily) unless contraindicated. All treatment decisions require clinical judgment and consideration of individual patient factors, including patient preferences, co-morbidities, previous drug use, risk factors not captured in the FRAX model (e.g., frailty, falls, vitamin D deficiency, increased bone turnover, interval significant decline in bone density) and possible under- or over-estimation of fracture risk by FRAX. The National Osteoporosis Foundation recommends that FDA-approved medical therapies be considered in postmenopausal women and men age 67 or older with a: 1. Hip or vertebral (clinical or morphometric) fracture. 2. T-score of -2.5 or lower at the spine or hip. 3. Ten-year fracture probability by FRAX of 3% or greater for hip fracture or 20% or greater for major osteoporotic fracture. People with diagnosed cases of osteoporosis or at high risk for fracture should have regular bone mineral density tests. For patients eligible for Medicare, routine testing is allowed once every 2 years. The testing frequency can be  increased to one year for patients who have rapidly progressing disease, those who are receiving or discontinuing medical therapy to restore bone mass, or have additional risk factors. [facility]) Criteria: Normal: T-scores from +1.0 to -1.0 Low Bone Mass (Osteopenia): T-scores between -1.0 and -2.5 Osteoporosis: T-scores -2.5 and below Comparison to Reference Population: T-score is the key measure used in the diagnosis of osteoporosis and relative risk determination for fracture. It provides a value for bone mass relative to the mean bone mass of a young adult reference population expressed in terms of standard deviation (SD). Z-score is the age-matched score showing the patient's values compared to a population matched for age, sex, and race. This is also expressed in terms of standard deviation. The patient may have values that compare favorably to the age-matched values and still be at increased risk for fracture. Electronically Signed   By: Lillia Mountain M.D.   On: 07/01/2014 15:23   Mm Screening Breast Tomo Bilateral  07/01/2014  CLINICAL DATA:  Screening. EXAM: DIGITAL SCREENING BILATERAL MAMMOGRAM WITH 3D TOMO WITH CAD COMPARISON:  Previous exam(s). ACR Breast Density Category c: The breast tissue is heterogeneously dense, which may obscure small masses. FINDINGS: There are no findings suspicious for malignancy. Images were processed with CAD. IMPRESSION: No mammographic evidence of  malignancy. A result letter of this screening mammogram will be mailed directly to the patient. RECOMMENDATION: Screening mammogram in one year. (Code:SM-B-01Y) BI-RADS CATEGORY  1: Negative. Electronically Signed   By: Lajean Manes M.D.   On: 07/01/2014 13:12     Assessment & Plan:  Plan I am having Ms. Berkery start on omeprazole. I am also having her maintain her multivitamin, traMADol, ALPRAZolam, and escitalopram.  Meds ordered this encounter  Medications  . omeprazole (PRILOSEC) 20 MG capsule     Sig: Take 1 capsule (20 mg total) by mouth daily.    Dispense:  30 capsule    Refill:  3    Problem List Items Addressed This Visit    GERD (gastroesophageal reflux disease)    Omeprazole HO on gerd diet given to pt Check labs If no relief--  Refer GI      Relevant Medications   omeprazole (PRILOSEC) 20 MG capsule   Anxiety    con't lexapro and xanax rto 3 months or sooner prn       Other Visit Diagnoses    Epigastric pain    -  Primary    Relevant Medications    omeprazole (PRILOSEC) 20 MG capsule    Other Relevant Orders    Comp Met (CMET)    CBC with Differential/Platelet    H. pylori antibody, IgG    Amylase    Lipase    Anxiety disorder, unspecified anxiety disorder type           Follow-up: Return in about 3 months (around 07/17/2015), or if symptoms worsen or fail to improve.  Garnet Koyanagi, DO

## 2015-04-18 NOTE — Patient Instructions (Signed)
Food Choices for Gastroesophageal Reflux Disease, Adult When you have gastroesophageal reflux disease (GERD), the foods you eat and your eating habits are very important. Choosing the right foods can help ease the discomfort of GERD. WHAT GENERAL GUIDELINES DO I NEED TO FOLLOW?  Choose fruits, vegetables, whole grains, low-fat dairy products, and low-fat meat, fish, and poultry.  Limit fats such as oils, salad dressings, butter, nuts, and avocado.  Keep a food diary to identify foods that cause symptoms.  Avoid foods that cause reflux. These may be different for different people.  Eat frequent small meals instead of three large meals each day.  Eat your meals slowly, in a relaxed setting.  Limit fried foods.  Cook foods using methods other than frying.  Avoid drinking alcohol.  Avoid drinking large amounts of liquids with your meals.  Avoid bending over or lying down until 2-3 hours after eating. WHAT FOODS ARE NOT RECOMMENDED? The following are some foods and drinks that may worsen your symptoms: Vegetables Tomatoes. Tomato juice. Tomato and spaghetti sauce. Chili peppers. Onion and garlic. Horseradish. Fruits Oranges, grapefruit, and lemon (fruit and juice). Meats High-fat meats, fish, and poultry. This includes hot dogs, ribs, ham, sausage, salami, and bacon. Dairy Whole milk and chocolate milk. Sour cream. Cream. Butter. Ice cream. Cream cheese.  Beverages Coffee and tea, with or without caffeine. Carbonated beverages or energy drinks. Condiments Hot sauce. Barbecue sauce.  Sweets/Desserts Chocolate and cocoa. Donuts. Peppermint and spearmint. Fats and Oils High-fat foods, including French fries and potato chips. Other Vinegar. Strong spices, such as black pepper, white pepper, red pepper, cayenne, curry powder, cloves, ginger, and chili powder. The items listed above may not be a complete list of foods and beverages to avoid. Contact your dietitian for more  information.   This information is not intended to replace advice given to you by your health care provider. Make sure you discuss any questions you have with your health care provider.   Document Released: 04/22/2005 Document Revised: 05/13/2014 Document Reviewed: 02/24/2013 Elsevier Interactive Patient Education 2016 [employer]  

## 2015-04-18 NOTE — Assessment & Plan Note (Signed)
Omeprazole HO on gerd diet given to pt Check labs If no relief--  Refer GI

## 2015-04-18 NOTE — Progress Notes (Signed)
Pre visit review using our clinic review tool, if applicable. No additional management support is needed unless otherwise documented below in the visit note. 

## 2015-04-18 NOTE — Assessment & Plan Note (Signed)
con't lexapro and xanax rto 3 months or sooner prn

## 2015-04-19 LAB — COMPREHENSIVE METABOLIC PANEL
ALK PHOS: 129 U/L — AB (ref 39–117)
ALT: 21 U/L (ref 0–35)
AST: 26 U/L (ref 0–37)
Albumin: 4.3 g/dL (ref 3.5–5.2)
BILIRUBIN TOTAL: 0.4 mg/dL (ref 0.2–1.2)
BUN: 14 mg/dL (ref 6–23)
CO2: 27 meq/L (ref 19–32)
CREATININE: 0.78 mg/dL (ref 0.40–1.20)
Calcium: 9.8 mg/dL (ref 8.4–10.5)
Chloride: 105 mEq/L (ref 96–112)
GFR: 80.34 mL/min (ref >60.00)
GLUCOSE: 94 mg/dL (ref 70–99)
Potassium: 3.9 mEq/L (ref 3.5–5.1)
SODIUM: 140 meq/L (ref 135–145)
Total Protein: 7.8 g/dL (ref 6.0–8.3)

## 2015-04-19 LAB — CBC WITH DIFFERENTIAL/PLATELET
BASOS ABS: 0.1 10*3/uL (ref 0.0–0.1)
Basophils Relative: 0.7 % (ref 0.0–3.0)
Eosinophils Absolute: 0.1 10*3/uL (ref 0.0–0.7)
Eosinophils Relative: 1 % (ref 0.0–5.0)
HCT: 42.1 % (ref 36.0–46.0)
Hemoglobin: 14 g/dL (ref 12.0–15.0)
LYMPHS ABS: 2.7 10*3/uL (ref 0.7–4.0)
Lymphocytes Relative: 26.6 % (ref 12.0–46.0)
MCHC: 33.2 g/dL (ref 30.0–36.0)
MCV: 95 fl (ref 78.0–100.0)
MONO ABS: 0.6 10*3/uL (ref 0.1–1.0)
Monocytes Relative: 6.2 % (ref 3.0–12.0)
NEUTROS ABS: 6.7 10*3/uL (ref 1.4–7.7)
NEUTROS PCT: 65.5 % (ref 43.0–77.0)
Platelets: 280 10*3/uL (ref 150.0–400.0)
RBC: 4.44 Mil/uL (ref 3.87–5.11)
RDW: 12.9 % (ref 11.5–15.5)
WBC: 10.2 10*3/uL (ref 4.0–10.5)

## 2015-04-19 LAB — AMYLASE: Amylase: 35 U/L (ref 27–131)

## 2015-04-19 LAB — H. PYLORI ANTIBODY, IGG: H PYLORI IGG: NEGATIVE

## 2015-04-19 LAB — LIPASE: LIPASE: 38 U/L (ref 11.0–59.0)

## 2015-05-10 ENCOUNTER — Other Ambulatory Visit: Payer: Self-pay

## 2015-05-10 DIAGNOSIS — F411 Generalized anxiety disorder: Secondary | ICD-10-CM

## 2015-05-10 MED ORDER — ESCITALOPRAM OXALATE 10 MG PO TABS: 10.0000 mg | ORAL_TABLET | Freq: Every day | ORAL | Status: DC

## 2015-06-26 ENCOUNTER — Ambulatory Visit (INDEPENDENT_AMBULATORY_CARE_PROVIDER_SITE_OTHER): Payer: Managed Care, Other (non HMO) | Admitting: Family Medicine

## 2015-06-26 ENCOUNTER — Encounter: Payer: Self-pay | Admitting: Family Medicine

## 2015-06-26 VITALS — BP 140/88 | HR 76 | Temp 98.8°F | Ht 64.0 in | Wt 213.0 lb

## 2015-06-26 DIAGNOSIS — F411 Generalized anxiety disorder: Secondary | ICD-10-CM | POA: Diagnosis not present

## 2015-06-26 DIAGNOSIS — Z0000 Encounter for general adult medical examination without abnormal findings: Secondary | ICD-10-CM

## 2015-06-26 DIAGNOSIS — Z1159 Encounter for screening for other viral diseases: Secondary | ICD-10-CM | POA: Diagnosis not present

## 2015-06-26 DIAGNOSIS — Z114 Encounter for screening for human immunodeficiency virus [HIV]: Secondary | ICD-10-CM

## 2015-06-26 MED ORDER — ESCITALOPRAM OXALATE 10 MG PO TABS: 10.0000 mg | ORAL_TABLET | Freq: Every day | ORAL | Status: DC

## 2015-06-26 NOTE — Progress Notes (Signed)
Subjective:     Kelli Gould is a 60 y.o. female and is here for a comprehensive physical exam. The patient reports no problems.  Social History   Social History  . Marital Status: Married    Spouse Name: N/A  . Number of Children: N/A  . Years of Education: N/A   Occupational History  . Not on file.   Social History Main Topics  . Smoking status: Never Smoker   . Smokeless tobacco: Never Used  . Alcohol Use: No  . Drug Use: No  . Sexual Activity:    Partners: Male   Other Topics Concern  . Not on file   Social History Narrative   Exercise--walk---not enough per pt   Health Maintenance  Topic Date Due  . Hepatitis C Screening  1956-04-09  . HIV Screening  07/04/2015 (Originally 04/18/1971)  . PAP SMEAR  10/17/2015  . INFLUENZA VACCINE  12/05/2015  . MAMMOGRAM  06/30/2016  . COLONOSCOPY  03/06/2020  . TETANUS/TDAP  07/17/2021    The following portions of the patient's history were reviewed and updated as appropriate:  She  has a past medical history of Anxiety. She  does not have any pertinent problems on file. She  has past surgical history that includes Cholecystectomy (1994); Appendectomy (1995); Back surgery; Spine surgery (7793,90,30); and Vaginal hysterectomy (1994). Her family history includes Breast cancer in her maternal aunt; Diabetes (age of onset: 30) in her father; Heart disease (age of onset: 74) in her mother; Hypertension (age of onset: 71) in her mother. She  reports that she has never smoked. She has never used smokeless tobacco. She reports that she does not drink alcohol or use illicit drugs. She has a current medication list which includes the following prescription(s): alprazolam, escitalopram, multivitamin, omeprazole, and tramadol. Current Outpatient Prescriptions on File Prior to Visit  Medication Sig Dispense Refill  . ALPRAZolam (XANAX) 0.5 MG tablet TAKE 1 TABLET BY MOUTH 3 TIMES A DAY AS NEEDED 60 tablet 1  . Multiple Vitamin  (MULTIVITAMIN) tablet Take 1 tablet by mouth daily.    Marland Kitchen omeprazole (PRILOSEC) 20 MG capsule Take 1 capsule (20 mg total) by mouth daily. 30 capsule 3  . traMADol (ULTRAM) 50 MG tablet Take 1 tablet (50 mg total) by mouth every 6 (six) hours as needed. 30 tablet 0   No current facility-administered medications on file prior to visit.   She has No Known Allergies..  Review of Systems Review of Systems  Constitutional: Negative for activity change, appetite change and fatigue.  HENT: Negative for hearing loss, congestion, tinnitus and ear discharge.  dentist q16mEyes: Negative for visual disturbance (see optho q1y -- vision corrected to 20/20 with glasses).  Respiratory: Negative for cough, chest tightness and shortness of breath.   Cardiovascular: Negative for chest pain, palpitations and leg swelling.  Gastrointestinal: Negative for abdominal pain, diarrhea, constipation and abdominal distention.  Genitourinary: Negative for urgency, frequency, decreased urine volume and difficulty urinating.  Musculoskeletal: Negative for back pain, arthralgias and gait problem.  Skin: Negative for color change, pallor and rash.  Neurological: Negative for dizziness, light-headedness, numbness and headaches.  Hematological: Negative for adenopathy. Does not bruise/bleed easily.  Psychiatric/Behavioral: Negative for suicidal ideas, confusion, sleep disturbance, self-injury, dysphoric mood, decreased concentration and agitation.      Objective:    BP 140/88 mmHg  Pulse 76  Temp(Src) 98.8 F (37.1 C) (Oral)  Ht _0  (1.626 m)  Wt 213 lb (96.616 kg)  BMI  36.54 kg/m2  SpO2 99% General appearance: alert, cooperative, appears stated age and no distress Head: Normocephalic, without obvious abnormality, atraumatic Eyes: conjunctivae/corneas clear. PERRL, EOM's intact. Fundi benign. Ears: normal TM's and external ear canals both ears Nose: Nares normal. Septum midline. Mucosa normal. No drainage or  sinus tenderness. Throat: lips, mucosa, and tongue normal; teeth and gums normal Neck: no adenopathy, no carotid bruit, no JVD, supple, symmetrical, trachea midline and thyroid not enlarged, symmetric, no tenderness/mass/nodules Back: symmetric, no curvature. ROM normal. No CVA tenderness. Lungs: clear to auscultation bilaterally Breasts: normal appearance, no masses or tenderness Heart: regular rate and rhythm, S1, S2 normal, no murmur, click, rub or gallop Abdomen: soft, non-tender; bowel sounds normal; no masses,  no organomegaly Pelvic: deferred Extremities: extremities normal, atraumatic, no cyanosis or edema Pulses: 2+ and symmetric Skin: Skin color, texture, turgor normal. No rashes or lesions Lymph nodes: Cervical, supraclavicular, and axillary nodes normal. Neurologic: Alert and oriented X 3, normal strength and tone. Normal symmetric reflexes. Normal coordination and gait Psych- no depression, no anxiety      Assessment:    Healthy female exam.    Plan:  Check labs   ghm utd See After Visit Summary for Counseling Recommendations    1. Preventative health care Check labs ghm utd - Comp Met (CMET); Future - CBC with Differential/Platelet; Future - Lipid panel; Future - HIV antibody; Future - POCT urinalysis dipstick; Future - TSH; Future - Hepatitis C antibody; Future  2. Need for hepatitis C screening test   - Hepatitis C antibody; Future  3. Screening for HIV (human immunodeficiency virus)    - HIV antibody; Future  4. Generalized anxiety disorder   - escitalopram (LEXAPRO) 10 MG tablet; Take 1 tablet (10 mg total) by mouth daily.  Dispense: 90 tablet; Refill: 3

## 2015-06-26 NOTE — Patient Instructions (Addendum)
Preventive Care for Adults, Female A healthy lifestyle and preventive care can promote health and wellness. Preventive health guidelines for women include the following key practices.  A routine yearly physical is a good way to check with your health care provider about your health and preventive screening. It is a chance to share any concerns and updates on your health and to receive a thorough exam.  Visit your dentist for a routine exam and preventive care every 6 months. Brush your teeth twice a day and floss once a day. Good oral hygiene prevents tooth decay and gum disease.  The frequency of eye exams is based on your age, health, family medical history, use of contact lenses, and other factors. Follow your health care provider's recommendations for frequency of eye exams.  Eat a healthy diet. Foods like vegetables, fruits, whole grains, low-fat dairy products, and lean protein foods contain the nutrients you need without too many calories. Decrease your intake of foods high in solid fats, added sugars, and salt. Eat the right amount of calories for you.Get information about a proper diet from your health care provider, if necessary.  Regular physical exercise is one of the most important things you can do for your health. Most adults should get at least 150 minutes of moderate-intensity exercise (any activity that increases your heart rate and causes you to sweat) each week. In addition, most adults need muscle-strengthening exercises on 2 or more days a week.  Maintain a healthy weight. The body mass index (BMI) is a screening tool to identify possible weight problems. It provides an estimate of body fat based on height and weight. Your health care provider can find your BMI and can help you achieve or maintain a healthy weight.For adults 20 years and older:  A BMI below 18.5 is considered underweight.  A BMI of 18.5 to 24.9 is normal.  A BMI of 25 to 29.9 is considered overweight.  A  BMI of 30 and above is considered obese.  Maintain normal blood lipids and cholesterol levels by exercising and minimizing your intake of saturated fat. Eat a balanced diet with plenty of fruit and vegetables. Blood tests for lipids and cholesterol should begin at age 45 and be repeated every 5 years. If your lipid or cholesterol levels are high, you are over 50, or you are at high risk for heart disease, you may need your cholesterol levels checked more frequently.Ongoing high lipid and cholesterol levels should be treated with medicines if diet and exercise are not working.  If you smoke, find out from your health care provider how to quit. If you do not use tobacco, do not start.  Lung cancer screening is recommended for adults aged 45-80 years who are at high risk for developing lung cancer because of a history of smoking. A yearly low-dose CT scan of the lungs is recommended for people who have at least a 30-pack-year history of smoking and are a current smoker or have quit within the past 15 years. A pack year of smoking is smoking an average of 1 pack of cigarettes a day for 1 year (for example: 1 pack a day for 30 years or 2 packs a day for 15 years). Yearly screening should continue until the smoker has stopped smoking for at least 15 years. Yearly screening should be stopped for people who develop a health problem that would prevent them from having lung cancer treatment.  If you are pregnant, do not drink alcohol. If you are  breastfeeding, be very cautious about drinking alcohol. If you are not pregnant and choose to drink alcohol, do not have more than 1 drink per day. One drink is considered to be 12 ounces (355 mL) of beer, 5 ounces (148 mL) of wine, or 1.5 ounces (44 mL) of liquor.  Avoid use of street drugs. Do not share needles with anyone. Ask for help if you need support or instructions about stopping the use of drugs.  High blood pressure causes heart disease and increases the risk  of stroke. Your blood pressure should be checked at least every 1 to 2 years. Ongoing high blood pressure should be treated with medicines if weight loss and exercise do not work.  If you are 55-79 years old, ask your health care provider if you should take aspirin to prevent strokes.  Diabetes screening is done by taking a blood sample to check your blood glucose level after you have not eaten for a certain period of time (fasting). If you are not overweight and you do not have risk factors for diabetes, you should be screened once every 3 years starting at age 45. If you are overweight or obese and you are 40-70 years of age, you should be screened for diabetes every year as part of your cardiovascular risk assessment.  Breast cancer screening is essential preventive care for women. You should practice "breast self-awareness." This means understanding the normal appearance and feel of your breasts and may include breast self-examination. Any changes detected, no matter how small, should be reported to a health care provider. Women in their 20s and 30s should have a clinical breast exam (CBE) by a health care provider as part of a regular health exam every 1 to 3 years. After age 40, women should have a CBE every year. Starting at age 40, women should consider having a mammogram (breast X-ray test) every year. Women who have a family history of breast cancer should talk to their health care provider about genetic screening. Women at a high risk of breast cancer should talk to their health care providers about having an MRI and a mammogram every year.  Breast cancer gene (BRCA)-related cancer risk assessment is recommended for women who have family members with BRCA-related cancers. BRCA-related cancers include breast, ovarian, tubal, and peritoneal cancers. Having family members with these cancers may be associated with an increased risk for harmful changes (mutations) in the breast cancer genes BRCA1 and  BRCA2. Results of the assessment will determine the need for genetic counseling and BRCA1 and BRCA2 testing.  Your health care provider may recommend that you be screened regularly for cancer of the pelvic organs (ovaries, uterus, and vagina). This screening involves a pelvic examination, including checking for microscopic changes to the surface of your cervix (Pap test). You may be encouraged to have this screening done every 3 years, beginning at age 21.  For women ages 30-65, health care providers may recommend pelvic exams and Pap testing every 3 years, or they may recommend the Pap and pelvic exam, combined with testing for human papilloma virus (HPV), every 5 years. Some types of HPV increase your risk of cervical cancer. Testing for HPV may also be done on women of any age with unclear Pap test results.  Other health care providers may not recommend any screening for nonpregnant women who are considered low risk for pelvic cancer and who do not have symptoms. Ask your health care provider if a screening pelvic exam is right for   you.  If you have had past treatment for cervical cancer or a condition that could lead to cancer, you need Pap tests and screening for cancer for at least 20 years after your treatment. If Pap tests have been discontinued, your risk factors (such as having a new sexual partner) need to be reassessed to determine if screening should resume. Some women have medical problems that increase the chance of getting cervical cancer. In these cases, your health care provider may recommend more frequent screening and Pap tests.  Colorectal cancer can be detected and often prevented. Most routine colorectal cancer screening begins at the age of 50 years and continues through age 75 years. However, your health care provider may recommend screening at an earlier age if you have risk factors for colon cancer. On a yearly basis, your health care provider may provide home test kits to check  for hidden blood in the stool. Use of a small camera at the end of a tube, to directly examine the colon (sigmoidoscopy or colonoscopy), can detect the earliest forms of colorectal cancer. Talk to your health care provider about this at age 50, when routine screening begins. Direct exam of the colon should be repeated every 5-10 years through age 75 years, unless early forms of precancerous polyps or small growths are found.  People who are at an increased risk for hepatitis B should be screened for this virus. You are considered at high risk for hepatitis B if:  You were born in a country where hepatitis B occurs often. Talk with your health care provider about which countries are considered high risk.  Your parents were born in a high-risk country and you have not received a shot to protect against hepatitis B (hepatitis B vaccine).  You have HIV or AIDS.  You use needles to inject street drugs.  You live with, or have sex with, someone who has hepatitis B.  You get hemodialysis treatment.  You take certain medicines for conditions like cancer, organ transplantation, and autoimmune conditions.  Hepatitis C blood testing is recommended for all people born from 1945 through 1965 and any individual with known risks for hepatitis C.  Practice safe sex. Use condoms and avoid high-risk sexual practices to reduce the spread of sexually transmitted infections (STIs). STIs include gonorrhea, chlamydia, syphilis, trichomonas, herpes, HPV, and human immunodeficiency virus (HIV). Herpes, HIV, and HPV are viral illnesses that have no cure. They can result in disability, cancer, and death.  You should be screened for sexually transmitted illnesses (STIs) including gonorrhea and chlamydia if:  You are sexually active and are younger than 24 years.  You are older than 24 years and your health care provider tells you that you are at risk for this type of infection.  Your sexual activity has changed  since you were last screened and you are at an increased risk for chlamydia or gonorrhea. Ask your health care provider if you are at risk.  If you are at risk of being infected with HIV, it is recommended that you take a prescription medicine daily to prevent HIV infection. This is called preexposure prophylaxis (PrEP). You are considered at risk if:  You are sexually active and do not regularly use condoms or know the HIV status of your partner(s).  You take drugs by injection.  You are sexually active with a partner who has HIV.  Talk with your health care provider about whether you are at high risk of being infected with HIV. If   you choose to begin PrEP, you should first be tested for HIV. You should then be tested every 3 months for as long as you are taking PrEP.  Osteoporosis is a disease in which the bones lose minerals and strength with aging. This can result in serious bone fractures or breaks. The risk of osteoporosis can be identified using a bone density scan. Women ages 67 years and over and women at risk for fractures or osteoporosis should discuss screening with their health care providers. Ask your health care provider whether you should take a calcium supplement or vitamin D to reduce the rate of osteoporosis.  Menopause can be associated with physical symptoms and risks. Hormone replacement therapy is available to decrease symptoms and risks. You should talk to your health care provider about whether hormone replacement therapy is right for you.  Use sunscreen. Apply sunscreen liberally and repeatedly throughout the day. You should seek shade when your shadow is shorter than you. Protect yourself by wearing long sleeves, pants, a wide-brimmed hat, and sunglasses year round, whenever you are outdoors.  Once a month, do a whole body skin exam, using a mirror to look at the skin on your back. Tell your health care provider of new moles, moles that have irregular borders, moles that  are larger than a pencil eraser, or moles that have changed in shape or color.  Stay current with required vaccines (immunizations).  Influenza vaccine. All adults should be immunized every year.  Tetanus, diphtheria, and acellular pertussis (Td, Tdap) vaccine. Pregnant women should receive 1 dose of Tdap vaccine during each pregnancy. The dose should be obtained regardless of the length of time since the last dose. Immunization is preferred during the 27th-36th week of gestation. An adult who has not previously received Tdap or who does not know her vaccine status should receive 1 dose of Tdap. This initial dose should be followed by tetanus and diphtheria toxoids (Td) booster doses every 10 years. Adults with an unknown or incomplete history of completing a 3-dose immunization series with Td-containing vaccines should begin or complete a primary immunization series including a Tdap dose. Adults should receive a Td booster every 10 years.  Varicella vaccine. An adult without evidence of immunity to varicella should receive 2 doses or a second dose if she has previously received 1 dose. Pregnant females who do not have evidence of immunity should receive the first dose after pregnancy. This first dose should be obtained before leaving the health care facility. The second dose should be obtained 4-8 weeks after the first dose.  Human papillomavirus (HPV) vaccine. Females aged 13-26 years who have not received the vaccine previously should obtain the 3-dose series. The vaccine is not recommended for use in pregnant females. However, pregnancy testing is not needed before receiving a dose. If a female is found to be pregnant after receiving a dose, no treatment is needed. In that case, the remaining doses should be delayed until after the pregnancy. Immunization is recommended for any person with an immunocompromised condition through the age of 61 years if she did not get any or all doses earlier. During the  3-dose series, the second dose should be obtained 4-8 weeks after the first dose. The third dose should be obtained 24 weeks after the first dose and 16 weeks after the second dose.  Zoster vaccine. One dose is recommended for adults aged 30 years or older unless certain conditions are present.  Measles, mumps, and rubella (MMR) vaccine. Adults born  before 1957 generally are considered immune to measles and mumps. Adults born in 1957 or later should have 1 or more doses of MMR vaccine unless there is a contraindication to the vaccine or there is laboratory evidence of immunity to each of the three diseases. A routine second dose of MMR vaccine should be obtained at least 28 days after the first dose for students attending postsecondary schools, health care workers, or international travelers. People who received inactivated measles vaccine or an unknown type of measles vaccine during 1963-1967 should receive 2 doses of MMR vaccine. People who received inactivated mumps vaccine or an unknown type of mumps vaccine before 1979 and are at high risk for mumps infection should consider immunization with 2 doses of MMR vaccine. For females of childbearing age, rubella immunity should be determined. If there is no evidence of immunity, females who are not pregnant should be vaccinated. If there is no evidence of immunity, females who are pregnant should delay immunization until after pregnancy. Unvaccinated health care workers born before 1957 who lack laboratory evidence of measles, mumps, or rubella immunity or laboratory confirmation of disease should consider measles and mumps immunization with 2 doses of MMR vaccine or rubella immunization with 1 dose of MMR vaccine.  Pneumococcal 13-valent conjugate (PCV13) vaccine. When indicated, a person who is uncertain of his immunization history and has no record of immunization should receive the PCV13 vaccine. All adults 65 years of age and older should receive this  vaccine. An adult aged 19 years or older who has certain medical conditions and has not been previously immunized should receive 1 dose of PCV13 vaccine. This PCV13 should be followed with a dose of pneumococcal polysaccharide (PPSV23) vaccine. Adults who are at high risk for pneumococcal disease should obtain the PPSV23 vaccine at least 8 weeks after the dose of PCV13 vaccine. Adults older than 60 years of age who have normal immune system function should obtain the PPSV23 vaccine dose at least 1 year after the dose of PCV13 vaccine.  Pneumococcal polysaccharide (PPSV23) vaccine. When PCV13 is also indicated, PCV13 should be obtained first. All adults aged 65 years and older should be immunized. An adult younger than age 65 years who has certain medical conditions should be immunized. Any person who resides in a nursing home or long-term care facility should be immunized. An adult smoker should be immunized. People with an immunocompromised condition and certain other conditions should receive both PCV13 and PPSV23 vaccines. People with human immunodeficiency virus (HIV) infection should be immunized as soon as possible after diagnosis. Immunization during chemotherapy or radiation therapy should be avoided. Routine use of PPSV23 vaccine is not recommended for American Indians, [place] Natives, or people younger than 65 years unless there are medical conditions that require PPSV23 vaccine. When indicated, people who have unknown immunization and have no record of immunization should receive PPSV23 vaccine. One-time revaccination 5 years after the first dose of PPSV23 is recommended for people aged 19-64 years who have chronic kidney failure, nephrotic syndrome, asplenia, or immunocompromised conditions. People who received 1-2 doses of PPSV23 before age 65 years should receive another dose of PPSV23 vaccine at age 65 years or later if at least 5 years have passed since the previous dose. Doses of PPSV23 are not  needed for people immunized with PPSV23 at or after age 65 years.  Meningococcal vaccine. Adults with asplenia or persistent complement component deficiencies should receive 2 doses of quadrivalent meningococcal conjugate (MenACWY-D) vaccine. The doses should be obtained   at least 2 months apart. Microbiologists working with certain meningococcal bacteria, [place] recruits, people at risk during an outbreak, and people who travel to or live in countries with a high rate of meningitis should be immunized. A first-year college student up through age 34 years who is living in a residence hall should receive a dose if she did not receive a dose on or after her 16th birthday. Adults who have certain high-risk conditions should receive one or more doses of vaccine.  Hepatitis A vaccine. Adults who wish to be protected from this disease, have certain high-risk conditions, work with hepatitis A-infected animals, work in hepatitis A research labs, or travel to or work in countries with a high rate of hepatitis A should be immunized. Adults who were previously unvaccinated and who anticipate close contact with an international adoptee during the first 60 days after arrival in the [place] from a country with a high rate of hepatitis A should be immunized.  Hepatitis B vaccine. Adults who wish to be protected from this disease, have certain high-risk conditions, may be exposed to blood or other infectious body fluids, are household contacts or sex partners of hepatitis B positive people, are clients or workers in certain care facilities, or travel to or work in countries with a high rate of hepatitis B should be immunized.  Haemophilus influenzae type b (Hib) vaccine. A previously unvaccinated person with asplenia or sickle cell disease or having a scheduled splenectomy should receive 1 dose of Hib vaccine. Regardless of previous immunization, a recipient of a hematopoietic stem cell transplant should receive a  3-dose series 6-12 months after her successful transplant. Hib vaccine is not recommended for adults with HIV infection. Preventive Services / Frequency Ages 35 to 4 years  Blood pressure check.** / Every 3-5 years.  Lipid and cholesterol check.** / Every 5 years beginning at age 60.  Clinical breast exam.** / Every 3 years for women in their 71s and 10s.  BRCA-related cancer risk assessment.** / For women who have family members with a BRCA-related cancer (breast, ovarian, tubal, or peritoneal cancers).  Pap test.** / Every 2 years from ages 76 through 26. Every 3 years starting at age 61 through age 76 or 93 with a history of 3 consecutive normal Pap tests.  HPV screening.** / Every 3 years from ages 37 through ages 60 to 51 with a history of 3 consecutive normal Pap tests.  Hepatitis C blood test.** / For any individual with known risks for hepatitis C.  Skin self-exam. / Monthly.  Influenza vaccine. / Every year.  Tetanus, diphtheria, and acellular pertussis (Tdap, Td) vaccine.** / Consult your health care provider. Pregnant women should receive 1 dose of Tdap vaccine during each pregnancy. 1 dose of Td every 10 years.  Varicella vaccine.** / Consult your health care provider. Pregnant females who do not have evidence of immunity should receive the first dose after pregnancy.  HPV vaccine. / 3 doses over 6 months, if 93 and younger. The vaccine is not recommended for use in pregnant females. However, pregnancy testing is not needed before receiving a dose.  Measles, mumps, rubella (MMR) vaccine.** / You need at least 1 dose of MMR if you were born in 1957 or later. You may also need a 2nd dose. For females of childbearing age, rubella immunity should be determined. If there is no evidence of immunity, females who are not pregnant should be vaccinated. If there is no evidence of immunity, females who are  pregnant should delay immunization until after pregnancy.  Pneumococcal  13-valent conjugate (PCV13) vaccine.** / Consult your health care provider.  Pneumococcal polysaccharide (PPSV23) vaccine.** / 1 to 2 doses if you smoke cigarettes or if you have certain conditions.  Meningococcal vaccine.** / 1 dose if you are age 68 to 8 years and a Market researcher living in a residence hall, or have one of several medical conditions, you need to get vaccinated against meningococcal disease. You may also need additional booster doses.  Hepatitis A vaccine.** / Consult your health care provider.  Hepatitis B vaccine.** / Consult your health care provider.  Haemophilus influenzae type b (Hib) vaccine.** / Consult your health care provider. Ages 7 to 53 years  Blood pressure check.** / Every year.  Lipid and cholesterol check.** / Every 5 years beginning at age 25 years.  Lung cancer screening. / Every year if you are aged 11-80 years and have a 30-pack-year history of smoking and currently smoke or have quit within the past 15 years. Yearly screening is stopped once you have quit smoking for at least 15 years or develop a health problem that would prevent you from having lung cancer treatment.  Clinical breast exam.** / Every year after age 48 years.  BRCA-related cancer risk assessment.** / For women who have family members with a BRCA-related cancer (breast, ovarian, tubal, or peritoneal cancers).  Mammogram.** / Every year beginning at age 41 years and continuing for as long as you are in good health. Consult with your health care provider.  Pap test.** / Every 3 years starting at age 65 years through age 37 or 70 years with a history of 3 consecutive normal Pap tests.  HPV screening.** / Every 3 years from ages 72 years through ages 60 to 40 years with a history of 3 consecutive normal Pap tests.  Fecal occult blood test (FOBT) of stool. / Every year beginning at age 21 years and continuing until age 5 years. You may not need to do this test if you get  a colonoscopy every 10 years.  Flexible sigmoidoscopy or colonoscopy.** / Every 5 years for a flexible sigmoidoscopy or every 10 years for a colonoscopy beginning at age 35 years and continuing until age 48 years.  Hepatitis C blood test.** / For all people born from [birth year] through 1965 and any individual with known risks for hepatitis C.  Skin self-exam. / Monthly.  Influenza vaccine. / Every year.  Tetanus, diphtheria, and acellular pertussis (Tdap/Td) vaccine.** / Consult your health care provider. Pregnant women should receive 1 dose of Tdap vaccine during each pregnancy. 1 dose of Td every 10 years.  Varicella vaccine.** / Consult your health care provider. Pregnant females who do not have evidence of immunity should receive the first dose after pregnancy.  Zoster vaccine.** / 1 dose for adults aged 30 years or older.  Measles, mumps, rubella (MMR) vaccine.** / You need at least 1 dose of MMR if you were born in 1957 or later. You may also need a second dose. For females of childbearing age, rubella immunity should be determined. If there is no evidence of immunity, females who are not pregnant should be vaccinated. If there is no evidence of immunity, females who are pregnant should delay immunization until after pregnancy.  Pneumococcal 13-valent conjugate (PCV13) vaccine.** / Consult your health care provider.  Pneumococcal polysaccharide (PPSV23) vaccine.** / 1 to 2 doses if you smoke cigarettes or if you have certain conditions.  Meningococcal vaccine.** /  Consult your health care provider.  Hepatitis A vaccine.** / Consult your health care provider.  Hepatitis B vaccine.** / Consult your health care provider.  Haemophilus influenzae type b (Hib) vaccine.** / Consult your health care provider. Ages 44 years and over  Blood pressure check.** / Every year.  Lipid and cholesterol check.** / Every 5 years beginning at age 75 years.  Lung cancer screening. / Every year if you  are aged 43-80 years and have a 30-pack-year history of smoking and currently smoke or have quit within the past 15 years. Yearly screening is stopped once you have quit smoking for at least 15 years or develop a health problem that would prevent you from having lung cancer treatment.  Clinical breast exam.** / Every year after age 64 years.  BRCA-related cancer risk assessment.** / For women who have family members with a BRCA-related cancer (breast, ovarian, tubal, or peritoneal cancers).  Mammogram.** / Every year beginning at age 21 years and continuing for as long as you are in good health. Consult with your health care provider.  Pap test.** / Every 3 years starting at age 57 years through age 15 or 80 years with 3 consecutive normal Pap tests. Testing can be stopped between 65 and 70 years with 3 consecutive normal Pap tests and no abnormal Pap or HPV tests in the past 10 years.  HPV screening.** / Every 3 years from ages 20 years through ages 66 or 63 years with a history of 3 consecutive normal Pap tests. Testing can be stopped between 65 and 70 years with 3 consecutive normal Pap tests and no abnormal Pap or HPV tests in the past 10 years.  Fecal occult blood test (FOBT) of stool. / Every year beginning at age 45 years and continuing until age 47 years. You may not need to do this test if you get a colonoscopy every 10 years.  Flexible sigmoidoscopy or colonoscopy.** / Every 5 years for a flexible sigmoidoscopy or every 10 years for a colonoscopy beginning at age 70 years and continuing until age 42 years.  Hepatitis C blood test.** / For all people born from [birth year] through 1965 and any individual with known risks for hepatitis C.  Osteoporosis screening.** / A one-time screening for women ages 68 years and over and women at risk for fractures or osteoporosis.  Skin self-exam. / Monthly.  Influenza vaccine. / Every year.  Tetanus, diphtheria, and acellular pertussis (Tdap/Td)  vaccine.** / 1 dose of Td every 10 years.  Varicella vaccine.** / Consult your health care provider.  Zoster vaccine.** / 1 dose for adults aged 18 years or older.  Pneumococcal 13-valent conjugate (PCV13) vaccine.** / Consult your health care provider.  Pneumococcal polysaccharide (PPSV23) vaccine.** / 1 dose for all adults aged 83 years and older.  Meningococcal vaccine.** / Consult your health care provider.  Hepatitis A vaccine.** / Consult your health care provider.  Hepatitis B vaccine.** / Consult your health care provider.  Haemophilus influenzae type b (Hib) vaccine.** / Consult your health care provider. ** Family history and personal history of risk and conditions may change your health care provider's recommendations.   This information is not intended to replace advice given to you by your health care provider. Make sure you discuss any questions you have with your health care provider.   Document Released: 06/18/2001 Document Revised: 05/13/2014 Document Reviewed: 09/17/2010 Elsevier Interactive Patient Education [employer].  63--9

## 2015-06-26 NOTE — Progress Notes (Signed)
Pre visit review using our clinic review tool, if applicable. No additional management support is needed unless otherwise documented below in the visit note. 

## 2015-06-27 ENCOUNTER — Other Ambulatory Visit (INDEPENDENT_AMBULATORY_CARE_PROVIDER_SITE_OTHER): Payer: Managed Care, Other (non HMO)

## 2015-06-27 DIAGNOSIS — Z1159 Encounter for screening for other viral diseases: Secondary | ICD-10-CM

## 2015-06-27 DIAGNOSIS — R319 Hematuria, unspecified: Secondary | ICD-10-CM

## 2015-06-27 DIAGNOSIS — Z0000 Encounter for general adult medical examination without abnormal findings: Secondary | ICD-10-CM

## 2015-06-27 DIAGNOSIS — Z114 Encounter for screening for human immunodeficiency virus [HIV]: Secondary | ICD-10-CM

## 2015-06-27 LAB — LIPID PANEL
CHOLESTEROL: 194 mg/dL (ref 0–200)
HDL: 38.5 mg/dL — ABNORMAL LOW (ref >39.00)
LDL Cholesterol: 126 mg/dL — ABNORMAL HIGH (ref 0–99)
NonHDL: 155.32
TRIGLYCERIDES: 146 mg/dL (ref 0.0–149.0)
Total CHOL/HDL Ratio: 5
VLDL: 29.2 mg/dL (ref 0.0–40.0)

## 2015-06-27 LAB — COMPREHENSIVE METABOLIC PANEL
ALBUMIN: 4 g/dL (ref 3.5–5.2)
ALK PHOS: 105 U/L (ref 39–117)
ALT: 17 U/L (ref 0–35)
AST: 19 U/L (ref 0–37)
BILIRUBIN TOTAL: 0.3 mg/dL (ref 0.2–1.2)
BUN: 13 mg/dL (ref 6–23)
CO2: 26 mEq/L (ref 19–32)
CREATININE: 0.77 mg/dL (ref 0.40–1.20)
Calcium: 9.4 mg/dL (ref 8.4–10.5)
Chloride: 108 mEq/L (ref 96–112)
GFR: 81.5 mL/min (ref >60.00)
GLUCOSE: 91 mg/dL (ref 70–99)
POTASSIUM: 3.8 meq/L (ref 3.5–5.1)
SODIUM: 142 meq/L (ref 135–145)
TOTAL PROTEIN: 7.1 g/dL (ref 6.0–8.3)

## 2015-06-27 LAB — CBC WITH DIFFERENTIAL/PLATELET
Basophils Absolute: 0 10*3/uL (ref 0.0–0.1)
Basophils Relative: 0.6 % (ref 0.0–3.0)
EOS ABS: 0.1 10*3/uL (ref 0.0–0.7)
EOS PCT: 1.1 % (ref 0.0–5.0)
HCT: 39.2 % (ref 36.0–46.0)
Hemoglobin: 13.5 g/dL (ref 12.0–15.0)
LYMPHS ABS: 2.1 10*3/uL (ref 0.7–4.0)
Lymphocytes Relative: 30.4 % (ref 12.0–46.0)
MCHC: 34.4 g/dL (ref 30.0–36.0)
MCV: 93.6 fl (ref 78.0–100.0)
MONO ABS: 0.4 10*3/uL (ref 0.1–1.0)
Monocytes Relative: 6.2 % (ref 3.0–12.0)
NEUTROS PCT: 61.7 % (ref 43.0–77.0)
Neutro Abs: 4.3 10*3/uL (ref 1.4–7.7)
Platelets: 261 10*3/uL (ref 150.0–400.0)
RBC: 4.19 Mil/uL (ref 3.87–5.11)
RDW: 13 % (ref 11.5–15.5)
WBC: 7 10*3/uL (ref 4.0–10.5)

## 2015-06-27 LAB — POCT URINALYSIS DIPSTICK
Bilirubin, UA: NEGATIVE
Glucose, UA: NEGATIVE
Ketones, UA: NEGATIVE
LEUKOCYTES UA: NEGATIVE
Nitrite, UA: NEGATIVE
PH UA: 6
PROTEIN UA: NEGATIVE
Spec Grav, UA: >=1.03
Urobilinogen, UA: 0.2

## 2015-06-27 LAB — TSH: TSH: 2.69 u[IU]/mL (ref 0.35–4.50)

## 2015-06-27 NOTE — Addendum Note (Signed)
Addended by: Harley Alto on: 06/27/2015 09:44 AM   Modules accepted: Orders

## 2015-06-28 LAB — URINE CULTURE

## 2015-06-28 LAB — HIV ANTIBODY (ROUTINE TESTING W REFLEX): HIV: NONREACTIVE

## 2015-06-28 LAB — HEPATITIS C ANTIBODY: HCV AB: NEGATIVE

## 2015-07-17 ENCOUNTER — Ambulatory Visit: Payer: Managed Care, Other (non HMO) | Admitting: Family Medicine

## 2015-12-06 DIAGNOSIS — K219 Gastro-esophageal reflux disease without esophagitis: Secondary | ICD-10-CM | POA: Insufficient documentation

## 2015-12-06 DIAGNOSIS — R1313 Dysphagia, pharyngeal phase: Secondary | ICD-10-CM | POA: Insufficient documentation

## 2016-03-06 ENCOUNTER — Other Ambulatory Visit: Payer: Self-pay | Admitting: Family Medicine

## 2016-03-06 DIAGNOSIS — Z1231 Encounter for screening mammogram for malignant neoplasm of breast: Secondary | ICD-10-CM

## 2016-03-20 ENCOUNTER — Ambulatory Visit (INDEPENDENT_AMBULATORY_CARE_PROVIDER_SITE_OTHER): Payer: Managed Care, Other (non HMO) | Admitting: Family Medicine

## 2016-03-20 ENCOUNTER — Encounter: Payer: Self-pay | Admitting: Family Medicine

## 2016-03-20 VITALS — BP 120/86 | HR 77 | Temp 97.6°F | Ht 64.0 in | Wt 221.6 lb

## 2016-03-20 DIAGNOSIS — R1013 Epigastric pain: Secondary | ICD-10-CM

## 2016-03-20 LAB — COMPREHENSIVE METABOLIC PANEL
ALT: 17 U/L (ref 0–35)
AST: 20 U/L (ref 0–37)
Albumin: 4.2 g/dL (ref 3.5–5.2)
Alkaline Phosphatase: 121 U/L — ABNORMAL HIGH (ref 39–117)
BUN: 12 mg/dL (ref 6–23)
CHLORIDE: 105 meq/L (ref 96–112)
CO2: 30 meq/L (ref 19–32)
CREATININE: 0.74 mg/dL (ref 0.40–1.20)
Calcium: 9.5 mg/dL (ref 8.4–10.5)
GFR: 85.11 mL/min (ref >60.00)
GLUCOSE: 96 mg/dL (ref 70–99)
POTASSIUM: 4.3 meq/L (ref 3.5–5.1)
SODIUM: 141 meq/L (ref 135–145)
Total Bilirubin: 0.3 mg/dL (ref 0.2–1.2)
Total Protein: 7.4 g/dL (ref 6.0–8.3)

## 2016-03-20 LAB — LIPASE: Lipase: 38 U/L (ref 11.0–59.0)

## 2016-03-20 LAB — CBC WITH DIFFERENTIAL/PLATELET
BASOS PCT: 0.5 % (ref 0.0–3.0)
Basophils Absolute: 0 10*3/uL (ref 0.0–0.1)
EOS ABS: 0.1 10*3/uL (ref 0.0–0.7)
Eosinophils Relative: 1.3 % (ref 0.0–5.0)
HCT: 39.6 % (ref 36.0–46.0)
HEMOGLOBIN: 13.4 g/dL (ref 12.0–15.0)
LYMPHS ABS: 2.1 10*3/uL (ref 0.7–4.0)
Lymphocytes Relative: 25 % (ref 12.0–46.0)
MCHC: 33.9 g/dL (ref 30.0–36.0)
MCV: 93.5 fl (ref 78.0–100.0)
MONO ABS: 0.5 10*3/uL (ref 0.1–1.0)
Monocytes Relative: 6.4 % (ref 3.0–12.0)
NEUTROS PCT: 66.8 % (ref 43.0–77.0)
Neutro Abs: 5.6 10*3/uL (ref 1.4–7.7)
PLATELETS: 274 10*3/uL (ref 150.0–400.0)
RBC: 4.23 Mil/uL (ref 3.87–5.11)
RDW: 12.5 % (ref 11.5–15.5)
WBC: 8.4 10*3/uL (ref 4.0–10.5)

## 2016-03-20 NOTE — Patient Instructions (Signed)
Keep a symptom diary over the next 2 weeks. Foods/liquids that affect your discomfort, positions, your weight, etc.

## 2016-03-20 NOTE — Progress Notes (Signed)
Pre visit review using our clinic review tool, if applicable. No additional management support is needed unless otherwise documented below in the visit note. 

## 2016-03-20 NOTE — Progress Notes (Signed)
Chief Complaint  Patient presents with  . Abdominal Pain    Pt reports upper abdominal pain and everytime she eats it makes her sick/Pt reports taking Prilosec with no relief     Kelli Gould is here for abdominal pain.  Duration: 2 weeks Nighttime awakenings? No Bleeding? No Weight loss? No Palliation: nothing Provocation: Pushing on stomach, eating Associated symptoms: nausea Denies: vomiting and diarrhea, constipation, medication changes Treatment to date: Prilosec, helpful for GERD, not helpful for pain  ROS: Constitutional: No fevers GI: No V/D/C, no bleeding + pain  Past Medical History:  Diagnosis Date  . Anxiety    Family History  Problem Relation Age of Onset  . Heart disease Mother 6965    Lived until 3084  . Hypertension Mother 1465  . Breast cancer Maternal Aunt   . Diabetes Father 4475   Past Surgical History:  Procedure Laterality Date  . APPENDECTOMY  1995  . BACK SURGERY     x's 3 days--Fusion on 2 diferent disk  . CHOLECYSTECTOMY  1994  . SPINE SURGERY  2004,05,09    microdiscectomy, fusion  L3-4, L5-6-- Dr Shelle IronBeane  . VAGINAL HYSTERECTOMY  1994   TAH-- endometriosis    BP 120/86 (BP Location: Left Arm, Patient Position: Sitting, Cuff Size: Large)   Pulse 77   Temp 97.6 F (36.4 C) (Oral)   Ht 5\' 4"  (1.626 m)   Wt 221 lb 9.6 oz (100.5 kg)   SpO2 98%   BMI 38.04 kg/m  Gen.: Awake, alert, appears stated age HEENT: Mucous membranes moist without mucosal lesions, no erythema or exudate Skin: No rashes noted Heart: Regular rate and rhythm without murmurs, no LE edema Lungs: Clear auscultation bilaterally, no rales or wheezing, normal effort without accessory muscle use. Abdomen: Bowel sounds are present. Abdomen is soft, TTP in epigastric region, nondistended, no masses or organomegaly. Negative Murphy's, Rovsing's, McBurney's, and Carnett's sign. Psych: Age appropriate judgment and insight. Normal mood and affect.  Abdominal pain, epigastric - Plan:  Lipase, CBC w/Diff, Comprehensive metabolic panel  Orders as above. Belly labs.  Keep a symptom diary over next 2 weeks. Discussed warning signs (weight loss, nighttime awakenings, bleeding) to notify us of. F/u in 2 weeks. If no resolution, will refer to GI for hopeful EGD. Pt voiced understanding and agreement to the plan.  Jilda Rocheicholas Paul ColumbiaWendling, DO 03/20/16 11:52 AM

## 2016-04-02 ENCOUNTER — Ambulatory Visit (HOSPITAL_BASED_OUTPATIENT_CLINIC_OR_DEPARTMENT_OTHER)
Admission: RE | Admit: 2016-04-02 | Discharge: 2016-04-02 | Disposition: A | Discharge status: Home / Self Care | Payer: Managed Care, Other (non HMO) | Source: Ambulatory Visit | Attending: Family Medicine | Admitting: Family Medicine

## 2016-04-02 ENCOUNTER — Ambulatory Visit (INDEPENDENT_AMBULATORY_CARE_PROVIDER_SITE_OTHER): Payer: Managed Care, Other (non HMO) | Admitting: Family Medicine

## 2016-04-02 ENCOUNTER — Encounter: Payer: Self-pay | Admitting: Family Medicine

## 2016-04-02 VITALS — BP 150/82 | HR 81 | Temp 97.7°F | Resp 16 | Ht 64.0 in | Wt 218.6 lb

## 2016-04-02 DIAGNOSIS — Z9889 Other specified postprocedural states: Secondary | ICD-10-CM | POA: Diagnosis not present

## 2016-04-02 DIAGNOSIS — R1032 Left lower quadrant pain: Secondary | ICD-10-CM | POA: Diagnosis not present

## 2016-04-02 DIAGNOSIS — R1013 Epigastric pain: Secondary | ICD-10-CM | POA: Diagnosis not present

## 2016-04-02 LAB — COMPREHENSIVE METABOLIC PANEL
ALT: 17 U/L (ref 0–35)
AST: 24 U/L (ref 0–37)
Albumin: 4.3 g/dL (ref 3.5–5.2)
Alkaline Phosphatase: 117 U/L (ref 39–117)
BUN: 12 mg/dL (ref 6–23)
CALCIUM: 9.5 mg/dL (ref 8.4–10.5)
CHLORIDE: 105 meq/L (ref 96–112)
CO2: 28 meq/L (ref 19–32)
CREATININE: 0.77 mg/dL (ref 0.40–1.20)
GFR: 81.28 mL/min (ref >60.00)
Glucose, Bld: 156 mg/dL — ABNORMAL HIGH (ref 70–99)
Potassium: 3.8 mEq/L (ref 3.5–5.1)
SODIUM: 141 meq/L (ref 135–145)
Total Bilirubin: 0.3 mg/dL (ref 0.2–1.2)
Total Protein: 7.6 g/dL (ref 6.0–8.3)

## 2016-04-02 LAB — H. PYLORI ANTIBODY, IGG: H Pylori IgG: NEGATIVE

## 2016-04-02 MED ORDER — IOPAMIDOL (ISOVUE-300) INJECTION 61%
100.0000 mL | Freq: Once | INTRAVENOUS | Status: AC | PRN
  Administered 2016-04-02: 100 mL via INTRAVENOUS

## 2016-04-02 MED ORDER — DEXLANSOPRAZOLE 60 MG PO CPDR: 60.0000 mg | DELAYED_RELEASE_CAPSULE | Freq: Every day | ORAL | 11 refills | Status: DC

## 2016-04-02 NOTE — Progress Notes (Signed)
Patient ID: Kelli Gould, female    DOB: 05-20-1955  Age: 60 y.o. MRN: [id number]    Subjective:  Subjective  HPI Kelli BosJudy M Feil presents for pt has been to urgent care and here preveiously and is no better.   Reflux is better but not flank pain. No NVD  Review of Systems  Constitutional: Negative for appetite change, diaphoresis, fatigue and unexpected weight change.  Eyes: Negative for pain, redness and visual disturbance.  Respiratory: Negative for cough, chest tightness, shortness of breath and wheezing.   Cardiovascular: Negative for chest pain, palpitations and leg swelling.  Endocrine: Negative for cold intolerance, heat intolerance, polydipsia, polyphagia and polyuria.  Genitourinary: Negative for difficulty urinating, dysuria and frequency.  Neurological: Negative for dizziness, light-headedness, numbness and headaches.    History Past Medical History:  Diagnosis Date  . Anxiety     She has a past surgical history that includes Cholecystectomy (1994); Appendectomy (1995); Back surgery; Spine surgery (4696,29,52(2004,05,09); and Vaginal hysterectomy (1994).   Her family history includes Breast cancer in her maternal aunt; Crohn's disease in her father; Heart disease (age of onset: 5665) in her mother; Hypertension (age of onset: 7165) in her mother; Pancreatic cancer in her maternal grandmother.She reports that she has never smoked. She has never used smokeless tobacco. She reports that she does not drink alcohol or use drugs.  Current Outpatient Prescriptions on File Prior to Visit  Medication Sig Dispense Refill  . ALPRAZolam (XANAX) 0.5 MG tablet TAKE 1 TABLET BY MOUTH 3 TIMES A DAY AS NEEDED 60 tablet 1  . Biotin 10 MG CAPS Take 1 capsule by mouth daily.    . Cholecalciferol (VITAMIN D3) 2000 units capsule Take 1 capsule by mouth daily.    . DOCOSAHEXAENOIC ACID PO Take 1 g by mouth daily.    Marland Kitchen. escitalopram (LEXAPRO) 10 MG tablet Take 1 tablet (10 mg total) by mouth daily. 90  tablet 3  . Multiple Vitamin (MULTIVITAMIN) tablet Take 1 tablet by mouth daily.    . traMADol (ULTRAM) 50 MG tablet Take 1 tablet (50 mg total) by mouth every 6 (six) hours as needed. 30 tablet 0   No current facility-administered medications on file prior to visit.      Objective:  Objective  Physical Exam  Constitutional: She is oriented to person, place, and time. She appears well-developed and well-nourished.  HENT:  Head: Normocephalic and atraumatic.  Eyes: Conjunctivae and EOM are normal.  Neck: Normal range of motion. Neck supple. No JVD present. Carotid bruit is not present. No thyromegaly present.  Cardiovascular: Normal rate, regular rhythm and normal heart sounds.   No murmur heard. Pulmonary/Chest: Effort normal and breath sounds normal. No respiratory distress. She has no wheezes. She has no rales. She exhibits no tenderness.  Abdominal: She exhibits no distension. There is tenderness.  Musculoskeletal: She exhibits no edema.  Neurological: She is alert and oriented to person, place, and time.  Psychiatric: She has a normal mood and affect.  Nursing note and vitals reviewed. GI cocktail given with some relief BP (!) 150/82 (BP Location: Right Arm, Patient Position: Sitting, Cuff Size: Large)   Pulse 81   Temp 97.7 F (36.5 C) (Oral)   Resp 16   Ht 5\' 4"  (1.626 m)   Wt 218 lb 9.6 oz (99.2 kg)   SpO2 98%   BMI 37.52 kg/m  Wt Readings from Last 3 Encounters:  04/02/16 218 lb 9.6 oz (99.2 kg)  03/20/16 221 lb 9.6 oz (100.5  kg)  06/26/15 213 lb (96.6 kg)     Lab Results  Component Value Date   WBC 8.4 03/20/2016   HGB 13.4 03/20/2016   HCT 39.6 03/20/2016   PLT 274.0 03/20/2016   GLUCOSE 96 03/20/2016   CHOL 194 06/27/2015   TRIG 146.0 06/27/2015   HDL 38.50 (L) 06/27/2015   LDLDIRECT 138.8 10/16/2012   LDLCALC 126 (H) 06/27/2015   ALT 17 03/20/2016   AST 20 03/20/2016   NA 141 03/20/2016   K 4.3 03/20/2016   CL 105 03/20/2016   CREATININE 0.74  03/20/2016   BUN 12 03/20/2016   CO2 30 03/20/2016   TSH 2.69 06/27/2015   INR 0.9 05/08/2007    Dg Bone Density  Result Date: 07/01/2014 CLINICAL DATA:  Postmenopausal. EXAM: DUAL X-RAY ABSORPTIOMETRY (DXA) FOR BONE MINERAL DENSITY FINDINGS: AP LUMBAR SPINE (L1-L2) Bone Mineral Density (BMD):  1.149 g/cm2 Young Adult T-Score:  1.5 Z-Score:  2.7 LEFT FEMUR NECK Bone Mineral Density (BMD):  0.846 g/cm2 Young Adult T-Score: 0.0 Z-Score:  1.2 ASSESSMENT: Patient's diagnostic category is NORMAL by WHO Criteria. FRACTURE RISK: NOT INCREASED FRAX: World Health Organization FRAX assessment of absolute fracture risk is not calculated for this patient because the patient has all T-scores at or above -1.0. COMPARISON: None. Effective therapies are available in the form of bisphosphonates, selective estrogen receptor modulators, biologic agents, and hormone replacement therapy (for women). All patients should ensure an adequate intake of dietary calcium (1200 mg daily) and vitamin D (800 IU daily) unless contraindicated. All treatment decisions require clinical judgment and consideration of individual patient factors, including patient preferences, co-morbidities, previous drug use, risk factors not captured in the FRAX model (e.g., frailty, falls, vitamin D deficiency, increased bone turnover, interval significant decline in bone density) and possible under- or over-estimation of fracture risk by FRAX. The National Osteoporosis Foundation recommends that FDA-approved medical therapies be considered in postmenopausal women and men age 42 or older with a: 1. Hip or vertebral (clinical or morphometric) fracture. 2. T-score of -2.5 or lower at the spine or hip. 3. Ten-year fracture probability by FRAX of 3% or greater for hip fracture or 20% or greater for major osteoporotic fracture. People with diagnosed cases of osteoporosis or at high risk for fracture should have regular bone mineral density tests. For patients  eligible for Medicare, routine testing is allowed once every 2 years. The testing frequency can be increased to one year for patients who have rapidly progressing disease, those who are receiving or discontinuing medical therapy to restore bone mass, or have additional risk factors. World Science writer [facility]) Criteria: Normal: T-scores from +1.0 to -1.0 Low Bone Mass (Osteopenia): T-scores between -1.0 and -2.5 Osteoporosis: T-scores -2.5 and below Comparison to Reference Population: T-score is the key measure used in the diagnosis of osteoporosis and relative risk determination for fracture. It provides a value for bone mass relative to the mean bone mass of a young adult reference population expressed in terms of standard deviation (SD). Z-score is the age-matched score showing the patient's values compared to a population matched for age, sex, and race. This is also expressed in terms of standard deviation. The patient may have values that compare favorably to the age-matched values and still be at increased risk for fracture. Electronically Signed   By: Baird Lyons M.D.   On: 07/01/2014 15:23   Mm Screening Breast Tomo Bilateral  Result Date: 07/01/2014 CLINICAL DATA:  Screening. EXAM: DIGITAL SCREENING BILATERAL MAMMOGRAM WITH 3D TOMO  WITH CAD COMPARISON:  Previous exam(s). ACR Breast Density Category c: The breast tissue is heterogeneously dense, which may obscure small masses. FINDINGS: There are no findings suspicious for malignancy. Images were processed with CAD. IMPRESSION: No mammographic evidence of malignancy. A result letter of this screening mammogram will be mailed directly to the patient. RECOMMENDATION: Screening mammogram in one year. (Code:SM-B-01Y) BI-RADS CATEGORY  1: Negative. Electronically Signed   By: Amie Portlandavid  Ormond M.D.   On: 07/01/2014 13:12     Assessment & Plan:  Plan  I have discontinued Ms. Verner's omeprazole. I am also having her start on dexlansoprazole.  Additionally, I am having her maintain her multivitamin, traMADol, ALPRAZolam, escitalopram, Biotin, DOCOSAHEXAENOIC ACID PO, and Vitamin D3.  Meds ordered this encounter  Medications  . dexlansoprazole (DEXILANT) 60 MG capsule    Sig: Take 1 capsule (60 mg total) by mouth daily.    Dispense:  30 capsule    Refill:  11    Problem List Items Addressed This Visit    None    Visit Diagnoses    Left lower quadrant pain    -  Primary   Relevant Medications   dexlansoprazole (DEXILANT) 60 MG capsule   Other Relevant Orders   CT Abdomen Pelvis W Contrast (Completed)   CBC with Differential/Platelet   POCT urinalysis dipstick   Comprehensive metabolic panel   H. pylori antibody, IgG   Abdominal pain, epigastric       Relevant Medications   dexlansoprazole (DEXILANT) 60 MG capsule   Other Relevant Orders   CT Abdomen Pelvis W Contrast (Completed)   CBC with Differential/Platelet   POCT urinalysis dipstick   Comprehensive metabolic panel   H. pylori antibody, IgG    consider GI if CT normal  Follow-up: Return if symptoms worsen or fail to improve.  Donato SchultzYvonne R Lowne Chase, DO

## 2016-04-02 NOTE — Progress Notes (Signed)
Pre visit review using our clinic review tool, if applicable. No additional management support is needed unless otherwise documented below in the visit note. 

## 2016-04-02 NOTE — Patient Instructions (Signed)

## 2016-04-03 ENCOUNTER — Telehealth: Payer: Self-pay | Admitting: Family Medicine

## 2016-04-03 LAB — CBC WITH DIFFERENTIAL/PLATELET
BASOS PCT: 0.3 % (ref 0.0–3.0)
Basophils Absolute: 0 10*3/uL (ref 0.0–0.1)
EOS ABS: 0.1 10*3/uL (ref 0.0–0.7)
Eosinophils Relative: 1.1 % (ref 0.0–5.0)
HCT: 40.2 % (ref 36.0–46.0)
Hemoglobin: 13.9 g/dL (ref 12.0–15.0)
LYMPHS ABS: 2 10*3/uL (ref 0.7–4.0)
Lymphocytes Relative: 25.3 % (ref 12.0–46.0)
MCHC: 34.5 g/dL (ref 30.0–36.0)
MCV: 93.7 fl (ref 78.0–100.0)
MONO ABS: 0.3 10*3/uL (ref 0.1–1.0)
Monocytes Relative: 4.1 % (ref 3.0–12.0)
NEUTROS ABS: 5.5 10*3/uL (ref 1.4–7.7)
Neutrophils Relative %: 69.2 % (ref 43.0–77.0)
PLATELETS: 269 10*3/uL (ref 150.0–400.0)
RBC: 4.29 Mil/uL (ref 3.87–5.11)
RDW: 12.6 % (ref 11.5–15.5)
WBC: 8 10*3/uL (ref 4.0–10.5)

## 2016-04-03 NOTE — Telephone Encounter (Signed)
Informed patient that currently the labs have not been addressed by PCP, but will call once this information is made available. She voiced understanding. Prior to call ending, the patient stated that she "would like to make her doctor aware that she's continuing to have pain". Message routed to Dr. Zola ButtonLowne Chase.

## 2016-04-03 NOTE — Telephone Encounter (Signed)
°  Relation to WJ:XBJYpt:self Call back number:[phone]  Reason for call:  Patient inquiring about lab results, please advise

## 2016-04-04 ENCOUNTER — Emergency Department (HOSPITAL_COMMUNITY): Payer: Managed Care, Other (non HMO)

## 2016-04-04 ENCOUNTER — Encounter (HOSPITAL_COMMUNITY): Payer: Self-pay | Admitting: Vascular Surgery

## 2016-04-04 ENCOUNTER — Emergency Department (HOSPITAL_COMMUNITY)
Admission: EM | Admit: 2016-04-04 | Discharge: 2016-04-04 | Disposition: A | Discharge status: Home / Self Care | Payer: Managed Care, Other (non HMO) | Attending: Emergency Medicine | Admitting: Emergency Medicine

## 2016-04-04 ENCOUNTER — Telehealth: Payer: Self-pay

## 2016-04-04 ENCOUNTER — Telehealth: Payer: Self-pay | Admitting: Family Medicine

## 2016-04-04 DIAGNOSIS — Z79899 Other long term (current) drug therapy: Secondary | ICD-10-CM | POA: Diagnosis not present

## 2016-04-04 DIAGNOSIS — R109 Unspecified abdominal pain: Secondary | ICD-10-CM

## 2016-04-04 DIAGNOSIS — R1013 Epigastric pain: Secondary | ICD-10-CM | POA: Insufficient documentation

## 2016-04-04 LAB — COMPREHENSIVE METABOLIC PANEL
ALT: 25 U/L (ref 14–54)
AST: 30 U/L (ref 15–41)
Albumin: 3.9 g/dL (ref 3.5–5.0)
Alkaline Phosphatase: 108 U/L (ref 38–126)
Anion gap: 8 (ref 5–15)
BILIRUBIN TOTAL: 0.5 mg/dL (ref 0.3–1.2)
BUN: 10 mg/dL (ref 6–20)
CHLORIDE: 107 mmol/L (ref 101–111)
CO2: 24 mmol/L (ref 22–32)
CREATININE: 0.84 mg/dL (ref 0.44–1.00)
Calcium: 9.5 mg/dL (ref 8.9–10.3)
Glucose, Bld: 109 mg/dL — ABNORMAL HIGH (ref 65–99)
Potassium: 4 mmol/L (ref 3.5–5.1)
Sodium: 139 mmol/L (ref 135–145)
TOTAL PROTEIN: 7.1 g/dL (ref 6.5–8.1)

## 2016-04-04 LAB — CBC
HCT: 40.8 % (ref 36.0–46.0)
Hemoglobin: 13.5 g/dL (ref 12.0–15.0)
MCH: 31.7 pg (ref 26.0–34.0)
MCHC: 33.1 g/dL (ref 30.0–36.0)
MCV: 95.8 fL (ref 78.0–100.0)
PLATELETS: 248 10*3/uL (ref 150–400)
RBC: 4.26 MIL/uL (ref 3.87–5.11)
RDW: 12.6 % (ref 11.5–15.5)
WBC: 12.3 10*3/uL — AB (ref 4.0–10.5)

## 2016-04-04 LAB — URINALYSIS, ROUTINE W REFLEX MICROSCOPIC
Bilirubin Urine: NEGATIVE
GLUCOSE, UA: NEGATIVE mg/dL
KETONES UR: NEGATIVE mg/dL
NITRITE: NEGATIVE
PROTEIN: NEGATIVE mg/dL
Specific Gravity, Urine: 1.006 (ref 1.005–1.030)
pH: 6 (ref 5.0–8.0)

## 2016-04-04 LAB — URINE MICROSCOPIC-ADD ON

## 2016-04-04 LAB — POC OCCULT BLOOD, ED: FECAL OCCULT BLD: POSITIVE — AB

## 2016-04-04 LAB — LIPASE, BLOOD: LIPASE: 35 U/L (ref 11–51)

## 2016-04-04 MED ORDER — GI COCKTAIL ~~LOC~~
30.0000 mL | Freq: Once | ORAL | Status: AC
  Administered 2016-04-04: 30 mL via ORAL
  Filled 2016-04-04: qty 30

## 2016-04-04 MED ORDER — ACETAMINOPHEN 500 MG PO TABS
1000.0000 mg | ORAL_TABLET | Freq: Once | ORAL | Status: AC
  Administered 2016-04-04: 1000 mg via ORAL
  Filled 2016-04-04: qty 2

## 2016-04-04 MED ORDER — MORPHINE SULFATE (PF) 4 MG/ML IV SOLN
2.0000 mg | Freq: Once | INTRAVENOUS | Status: AC
  Administered 2016-04-04: 2 mg via INTRAVENOUS
  Filled 2016-04-04: qty 1

## 2016-04-04 NOTE — Telephone Encounter (Signed)
Spoke with pt, pt states she understands results but had further questions on how to get rid of the gas/air at the bases of her lungs? Please advise. LB

## 2016-04-04 NOTE — Telephone Encounter (Signed)
Patient called stating that she is seeing blood in her stool. Transferred to [facility]. Spoke with Onalee Huaavid.

## 2016-04-04 NOTE — ED Notes (Signed)
Pt transported to CT with transporter

## 2016-04-04 NOTE — Discharge Instructions (Signed)
Try zantac 150mg twice a day.  ° ° °

## 2016-04-04 NOTE — ED Provider Notes (Signed)
[facility]ENC[facility] DEPT Provider Note   CSN: [id number] Arrival date & time: 04/04/16  1729     History   Chief Complaint Chief Complaint  Patient presents with  . Flank Pain    HPI Kelli Gould is a 60 y.o. female.  60 yo F with a chief complaint of left-sided abdominal pain. Going on for the past 10 days. Having some loose stools associated with this as well. Denies fevers or chills denies nausea or vomiting. Described it as a pressure that worsened and now having some sharp component to it. Feels that it radiates up into her left chest and left shoulder. Denies exertional symptoms. Denies shortness breath denies diaphoresis denies lower extremity swelling. Patient had one episode of bright red blood on the toilet after she wiped. Deny blood in the bowl. Denied any pain in the rectum.   The history is provided by the patient.  Flank Pain  This is a new problem. The current episode started more than 1 week ago. The problem occurs constantly. The problem has been gradually worsening. Associated symptoms include abdominal pain. Pertinent negatives include no chest pain, no headaches and no shortness of breath. The symptoms are aggravated by eating. Nothing relieves the symptoms. She has tried nothing for the symptoms. The treatment provided no relief.    Past Medical History:  Diagnosis Date  . Anxiety     Patient Active Problem List   Diagnosis Date Noted  . GERD (gastroesophageal reflux disease) 10/18/2013  . Elevated BP 10/16/2012  . Chronic back pain 07/18/2011  . Anxiety 07/18/2011    Past Surgical History:  Procedure Laterality Date  . APPENDECTOMY  1995  . BACK SURGERY     x's 3 days--Fusion on 2 diferent disk  . CHOLECYSTECTOMY  1994  . SPINE SURGERY  2004,05,09    microdiscectomy, fusion  L3-4, L5-6-- Dr Shelle IronBeane  . VAGINAL HYSTERECTOMY  1994   TAH-- endometriosis    OB History    No data available       Home Medications    Prior to Admission  medications   Medication Sig Start Date End Date Taking? Authorizing Provider  ALPRAZolam (XANAX) 0.5 MG tablet TAKE 1 TABLET BY MOUTH 3 TIMES A DAY AS NEEDED Patient taking differently: Take 0.5 mg by mouth three times a day as needed for anxiety 03/09/15  Yes Yvonne R Lowne Chase, DO  Biotin 10 MG CAPS Take 1 capsule by mouth daily.   Yes Historical Provider, MD  Cholecalciferol (VITAMIN D3) 2000 units capsule Take 1 capsule by mouth daily.   Yes Historical Provider, MD  escitalopram (LEXAPRO) 10 MG tablet Take 1 tablet (10 mg total) by mouth daily. 06/26/15  Yes Yvonne R Lowne Chase, DO  traMADol (ULTRAM) 50 MG tablet Take 1 tablet (50 mg total) by mouth every 6 (six) hours as needed. 06/23/14  Yes Yvonne R Lowne Chase, DO  dexlansoprazole (DEXILANT) 60 MG capsule Take 1 capsule (60 mg total) by mouth daily. 04/02/16   Donato SchultzYvonne R Lowne Chase, DO    Family History Family History  Problem Relation Age of Onset  . Heart disease Mother 6165    Lived until 884  . Hypertension Mother 2865  . Crohn's disease Father   . Breast cancer Maternal Aunt   . Pancreatic cancer Maternal Grandmother     Social History Social History  Substance Use Topics  . Smoking status: Never Smoker  . Smokeless tobacco: Never Used  . Alcohol use No  Allergies   Patient has no known allergies.   Review of Systems Review of Systems  Constitutional: Negative for chills and fever.  HENT: Negative for congestion and rhinorrhea.   Eyes: Negative for redness and visual disturbance.  Respiratory: Negative for shortness of breath and wheezing.   Cardiovascular: Negative for chest pain and palpitations.  Gastrointestinal: Positive for abdominal pain, blood in stool and diarrhea. Negative for nausea, rectal pain and vomiting.  Genitourinary: Positive for flank pain. Negative for dysuria and urgency.  Musculoskeletal: Negative for arthralgias and myalgias.  Skin: Negative for pallor and wound.  Neurological: Negative  for dizziness and headaches.     Physical Exam Updated Vital Signs BP 146/69   Pulse 64   Temp 97.5 F (36.4 C) (Oral)   Resp 15   SpO2 98%   Physical Exam  Constitutional: She is oriented to person, place, and time. She appears well-developed and well-nourished. No distress.  HENT:  Head: Normocephalic and atraumatic.  Eyes: EOM are normal. Pupils are equal, round, and reactive to light.  Neck: Normal range of motion. Neck supple.  Cardiovascular: Normal rate and regular rhythm.  Exam reveals no gallop and no friction rub.   No murmur heard. Pulmonary/Chest: Effort normal. She has no wheezes. She has no rales.  Abdominal: Soft. She exhibits no distension and no mass. There is tenderness (epigastric). There is no guarding.  Genitourinary:     Musculoskeletal: She exhibits no edema or tenderness.  Neurological: She is alert and oriented to person, place, and time.  Skin: Skin is warm and dry. She is not diaphoretic.  Psychiatric: She has a normal mood and affect. Her behavior is normal.  Nursing note and vitals reviewed.    ED Treatments / Results  Labs (all labs ordered are listed, but only abnormal results are displayed) Labs Reviewed  COMPREHENSIVE METABOLIC PANEL - Abnormal; Notable for the following:       Result Value   Glucose, Bld 109 (*)    All other components within normal limits  CBC - Abnormal; Notable for the following:    WBC 12.3 (*)    All other components within normal limits  URINALYSIS, ROUTINE W REFLEX MICROSCOPIC (NOT AT [facility]) - Abnormal; Notable for the following:    Hgb urine dipstick MODERATE (*)    Leukocytes, UA TRACE (*)    All other components within normal limits  URINE MICROSCOPIC-ADD ON - Abnormal; Notable for the following:    Squamous Epithelial / LPF 0-5 (*)    Bacteria, UA FEW (*)    All other components within normal limits  POC OCCULT BLOOD, ED - Abnormal; Notable for the following:    Fecal Occult Bld POSITIVE (*)    All  other components within normal limits  GASTROINTESTINAL PANEL BY PCR, STOOL (REPLACES STOOL CULTURE)  LIPASE, BLOOD    EKG  EKG Interpretation  Date/Time:  Thursday April 04 2016 18:16:47 EST Ventricular Rate:  76 PR Interval:    QRS Duration: 85 QT Interval:  404 QTC Calculation: 455 R Axis:   52 Text Interpretation:  Sinus rhythm Low voltage, precordial leads No significant change since last tracing Confirmed by Darrius Montano MD, Reuel Boom ([id number]) on 04/04/2016 7:42:01 PM Also confirmed by Adela Lank MD, DANIEL [phone]), editor [place]  CCT, BEVERLY ([id number])  on 04/05/2016 7:06:43 AM       Radiology Dg Chest 2 View  Result Date: 04/04/2016 CLINICAL DATA:  Left chest pain for 10 days EXAM: CHEST  2 VIEW COMPARISON:  05/13/2007 FINDINGS: Normal heart size and mediastinal contours. No acute infiltrate or edema. No effusion or pneumothorax. Cholecystectomy clips. No acute osseous findings. IMPRESSION: No active cardiopulmonary disease. Electronically Signed   By: Marnee SpringJonathon  Watts M.D.   On: 04/04/2016 19:00   Ct Renal Stone Study  Result Date: 04/04/2016 CLINICAL DATA:  Left-sided pain. EXAM: CT ABDOMEN AND PELVIS WITHOUT CONTRAST TECHNIQUE: Multidetector CT imaging of the abdomen and pelvis was performed following the standard protocol without IV contrast. COMPARISON:  Two days ago FINDINGS: Lower chest:  No contributory findings. Hepatobiliary: No focal liver abnormality.Cholecystectomy. Pancreas: Unremarkable. Spleen: Unremarkable. Adrenals/Urinary Tract: Negative adrenals. No hydronephrosis or stone. Unremarkable bladder. Stomach/Bowel: No obstruction. No inflammatory changes. Status post appendectomy. Oral contrast in the colon from previous CT. Vascular/Lymphatic: No acute vascular abnormality. No mass or adenopathy. Reproductive:Hysterectomy.  Negative adnexae. Other: No ascites or pneumoperitoneum. Musculoskeletal: L4-5 and L5-S1 discectomy and solid bony fusion. Advanced adjacent segment facet  arthropathy with L3-4 grade 1 anterolisthesis. IMPRESSION: No acute finding or change from 2 days prior. Electronically Signed   By: Marnee SpringJonathon  Watts M.D.   On: 04/04/2016 20:06    Procedures Procedures (including critical care time)  Medications Ordered in ED Medications  gi cocktail (Maalox,Lidocaine,Donnatal) (30 mLs Oral Given 04/04/16 1817)  acetaminophen (TYLENOL) tablet 1,000 mg (1,000 mg Oral Given 04/04/16 1817)  morphine 4 MG/ML injection 2 mg (2 mg Intravenous Given 04/04/16 2019)     Initial Impression / Assessment and Plan / ED Course  I have reviewed the triage vital signs and the nursing notes.  Pertinent labs & imaging results that were available during my care of the patient were reviewed by me and considered in my medical decision making (see chart for details).  Clinical Course     60 yo F with L flank pain. This has been an insidious course. Worsening today, with some rectal bleeding.  No noted melena, hx more concerning for hemorrhoid, though none on exam. With worsening pain and now leukocytosis will re-CT.   CT with no acute pathology.  Patient with continued pain after GI cocktail.  CXR negative.  ECG with no significant findings. Doubt PE or atypical acs.  Feel most likely gastric pathology, given GI follow up.    I have discussed the diagnosis/risks/treatment options with the patient and family and believe the pt to be eligible for discharge home to follow-up with PCP, GI. We also discussed returning to the ED immediately if new or worsening sx occur. We discussed the sx which are most concerning (e.g., sudden worsening pain, fever, inability to tolerate by mouth) that necessitate immediate return. Medications administered to the patient during their visit and any new prescriptions provided to the patient are listed below.  Medications given during this visit Medications  gi cocktail (Maalox,Lidocaine,Donnatal) (30 mLs Oral Given 04/04/16 1817)  acetaminophen  (TYLENOL) tablet 1,000 mg (1,000 mg Oral Given 04/04/16 1817)  morphine 4 MG/ML injection 2 mg (2 mg Intravenous Given 04/04/16 2019)     The patient appears reasonably screen and/or stabilized for discharge and I doubt any other medical condition or other [facility] requiring further screening, evaluation, or treatment in the ED at this time prior to discharge.      Final Clinical Impressions(s) / ED Diagnoses   Final diagnoses:  Left flank pain    New Prescriptions Discharge Medication List as of 04/04/2016  8:41 PM       Melene Planan Yug Loria, DO 04/05/16 [id number]

## 2016-04-04 NOTE — Telephone Encounter (Signed)
[facility] Day - Client TELEPHONE ADVICE RECORD [facility] Patient Name: Kelli Gould Nguyenthi DOB: [birth year] Initial Comment has blood in stool that she just noticed today had CT scan tuesday, has air in lung, having pain on left side still Nurse Assessment Nurse: Ladona RidgelGaddy, RN, Felicia Date/Time (Eastern Time): 04/04/2016 3:44:13 PM Confirm and document reason for call. If symptomatic, describe symptoms. ---PT has blood in stool today 2x without straining. CT scan of lungs Tues and nurse said it showed air at base of lung but she was not told how to get rid of it. Pain in L side above naval - under breast (L chest) for 10 d. Pain level is 6. It has been worse last 2 d since she saw MD. They did blood work and said her blood sugar was a little high but she had just eaten lunch so they were not so worried. No fever. Lymph node on L is puffy under arm - she has no idea how big or if it is her imagination and she wont check.Does the patient have any new or worsening symptoms? ---Yes Will a triage be completed? ---Yes Related visit to physician within the last 2 weeks? ---Yes Does the PT have any chronic conditions? (i.e. diabetes, asthma, etc.) ---No Is this a behavioral health or substance abuse call? ---No Guidelines Guideline Title Affirmed Question Affirmed Notes Chest Pain [1] Chest pain lasts > 5 minutes AND [2] described as crushing, pressure-like, or heavy Final Disposition User Call EMS 911 Now Blue IslandGaddy, RN, Sunny SchleinFelicia Disagree/Comply: Comply Call Id: [phone]

## 2016-04-04 NOTE — ED Triage Notes (Addendum)
Pt reports to the ED for eval of left flank pain/abd pain x 10 days. She was seen by her PCP and she was given Zantac. She had a CT scan and blood work and everything was normal. Then today she had some blood in her stool. She also reports some nausea and diarrhea. Denies any active vomiting. Denies any hematuria or urinary symptoms

## 2016-04-04 NOTE — Telephone Encounter (Signed)
Spoke with pt, pt report having 3 bowel movements with blood. Pt report she was advised to go the ED and she is heading there now with husband. Advised pt to make follow up appointment with provider. LB

## 2016-04-04 NOTE — ED Notes (Addendum)
Pt ambulated to room, tolerated well with no complaints. Assisted into gown and placed on bp and o2 monitor.

## 2016-04-04 NOTE — Telephone Encounter (Signed)
-----   Message from Donato SchultzYvonne R Lowne Chase, DO sent at 04/02/2016  9:52 PM EST ----- Some air at lung bases --?gas otherwise ct is normal

## 2016-04-05 NOTE — Telephone Encounter (Signed)
Nothing to do for that.  Could take a few deep breaths every hour.   (covering for lowne).

## 2016-04-08 ENCOUNTER — Ambulatory Visit: Payer: Managed Care, Other (non HMO)

## 2016-04-08 NOTE — Telephone Encounter (Signed)
Labs were normal I see she went to ER Did she get an appointment with GI or does she need us to do that

## 2016-04-08 NOTE — Telephone Encounter (Signed)
Left detailed message on cell# and to call if any further questions.

## 2016-04-09 ENCOUNTER — Telehealth: Payer: Self-pay

## 2016-04-09 DIAGNOSIS — R7309 Other abnormal glucose: Secondary | ICD-10-CM

## 2016-04-09 NOTE — Telephone Encounter (Signed)
Spoke to patient, she already has a GI doctor and she followed up with them yesterday.

## 2016-04-09 NOTE — Telephone Encounter (Signed)
-----   Message from Donato SchultzYvonne R Lowne Chase, DO sent at 04/09/2016  8:50 AM EST ----- The air will usually go away --- deep breathing exerecises to fully expand lungs can help We can repeat the glucose fasting   ( bmp, hgba1c)

## 2016-04-09 NOTE — Telephone Encounter (Signed)
Future Labs ordered, BMP, Hgba1c.

## 2016-04-10 NOTE — Telephone Encounter (Signed)
Patient was made aware of lab results & provider's recommendations & verbalized understanding. Also, patient addressed that this past Monday, she was seen by her gastroenterologist, [facility].

## 2016-04-16 ENCOUNTER — Ambulatory Visit
Admission: RE | Admit: 2016-04-16 | Discharge: 2016-04-16 | Disposition: A | Discharge status: Home / Self Care | Payer: Managed Care, Other (non HMO) | Source: Ambulatory Visit | Attending: Family Medicine | Admitting: Family Medicine

## 2016-04-16 DIAGNOSIS — Z1231 Encounter for screening mammogram for malignant neoplasm of breast: Secondary | ICD-10-CM

## 2016-04-18 ENCOUNTER — Other Ambulatory Visit (INDEPENDENT_AMBULATORY_CARE_PROVIDER_SITE_OTHER): Payer: Managed Care, Other (non HMO)

## 2016-04-18 DIAGNOSIS — R7309 Other abnormal glucose: Secondary | ICD-10-CM | POA: Diagnosis not present

## 2016-04-18 LAB — HEMOGLOBIN A1C: Hgb A1c MFr Bld: 5.7 % (ref 4.6–6.5)

## 2016-04-18 LAB — BASIC METABOLIC PANEL WITH GFR
BUN: 16 mg/dL (ref 7–25)
CHLORIDE: 107 mmol/L (ref 98–110)
CO2: 27 mmol/L (ref 20–31)
CREATININE: 0.89 mg/dL (ref 0.50–0.99)
Calcium: 9.4 mg/dL (ref 8.6–10.4)
GFR, Est African American: 81 mL/min (ref >=60)
GFR, Est Non African American: 71 mL/min (ref >=60)
Glucose, Bld: 93 mg/dL (ref 65–99)
Potassium: 4.3 mmol/L (ref 3.5–5.3)
SODIUM: 142 mmol/L (ref 135–146)

## 2016-04-23 ENCOUNTER — Encounter: Payer: Self-pay | Admitting: Family Medicine

## 2016-05-10 ENCOUNTER — Telehealth: Payer: Self-pay | Admitting: Genetic Counselor

## 2016-05-10 NOTE — Telephone Encounter (Signed)
Pt confirmed appt, review address and verified demo and insurance

## 2016-05-13 ENCOUNTER — Ambulatory Visit (HOSPITAL_BASED_OUTPATIENT_CLINIC_OR_DEPARTMENT_OTHER): Payer: Managed Care, Other (non HMO) | Admitting: Genetic Counselor

## 2016-05-13 ENCOUNTER — Other Ambulatory Visit: Payer: Managed Care, Other (non HMO)

## 2016-05-13 ENCOUNTER — Encounter: Payer: Self-pay | Admitting: Genetic Counselor

## 2016-05-13 DIAGNOSIS — Z803 Family history of malignant neoplasm of breast: Secondary | ICD-10-CM

## 2016-05-13 DIAGNOSIS — Z8041 Family history of malignant neoplasm of ovary: Secondary | ICD-10-CM

## 2016-05-13 NOTE — Progress Notes (Signed)
REFERRING PROVIDER: Ann Held, DO [address] [place], [place] [address]   Arvella Nigh, MD  PRIMARY PROVIDER:  Ann Held, DO  PRIMARY REASON FOR VISIT:  1. Family history of breast cancer   2. Family history of ovarian cancer      HISTORY OF PRESENT ILLNESS:   Kelli Gould, a 61 y.o. female, was seen for a [place] cancer genetics consultation at the request of Dr. Radene Knee due to a familyhistory of cancer.  Kelli Gould presents to clinic today to discuss the possibility of a hereditary predisposition to cancer, genetic testing, and to further clarify her future cancer risks, as well as potential cancer risks for family members. Kelli Gould is a 61 y.o. female with no personal history of cancer.  She has complained about left flank pain that radiates into her pelvic region.  She is concerned about her risk for ovarian cancer based on her maternal grandmother's diagnosis.  CANCER HISTORY:   No history exists.     HORMONAL RISK FACTORS:  Menarche was at age 61.  First live birth at age:51.  OCP use for approximately 2 years.  Ovaries intact: yes.  Hysterectomy: no.  Menopausal status: postmenopausal.  HRT use: 3 years. Colonoscopy: yes; normal. Mammogram within the last year: yes. Number of breast biopsies: 0. Up to date with pelvic exams:  yes. Any excessive radiation exposure in the past:  no  Past Medical History:  Diagnosis Date  . Anxiety   . Family history of breast cancer   . Family history of ovarian cancer     Past Surgical History:  Procedure Laterality Date  . APPENDECTOMY  1995  . BACK SURGERY     x's 3 days--Fusion on 2 diferent disk  . CHOLECYSTECTOMY  1994  . SPINE SURGERY  2004,05,09    microdiscectomy, fusion  L3-4, L5-6-- Dr Tonita Cong  . VAGINAL HYSTERECTOMY  1994   TAH-- endometriosis    Social History   Social History  . Marital status: Married    Spouse name: N/A  . Number of children: N/A  . Years of  education: N/A   Social History Main Topics  . Smoking status: Never Smoker  . Smokeless tobacco: Never Used  . Alcohol use No  . Drug use: No  . Sexual activity: Yes    Partners: Male   Other Topics Concern  . None   Social History Narrative   Exercise--walk---not enough per pt     FAMILY HISTORY:  We obtained a detailed, 4-generation family history.  Significant diagnoses are listed below: Family History  Problem Relation Age of Onset  . Heart disease Mother 29    Lived until 57  . Hypertension Mother 18  . Dementia Mother   . Crohn's disease Father   . Heart disease Maternal Aunt   . Ovarian cancer Maternal Grandmother 34  . Heart disease Maternal Grandfather   . Breast cancer Other     MGMs sister dx under 52  . Breast cancer Other     MFMs sister dx under 41  . Lung cancer Other     2 MGMs brothers with lung cancer  . Skin cancer Other     MGMs brother with skin cancer    The patient has two children who are cancer free.  She has a sister and brother who are both cancer free.  Her mother died at 44 from pneumonia and a potential PE.  She had  one sister who died from heart problems. This sister had two children, one who has prostate cancer.  The patient's maternal grandmother had ovarian cancer at 80.  She had 12 siblings.  Two sisters had breast cancer under the age of 31, two brothers had lung cancer, one brother had skin cancer and was a farmer, and another brother had an unknown cancer.  The patient did not know her father.  He did not have cancer and died at 47.  There is no information about his side of the family.   Kelli Gould is unaware of previous family history of genetic testing for hereditary cancer risks. Patient's maternal ancestors are of Caucasian descent, and paternal ancestors are of [place] descent. There is no reported Ashkenazi Jewish ancestry. There is no known consanguinity.  GENETIC COUNSELING ASSESSMENT: Kelli Gould is a 61 y.o. female  with a family history of breast and ovarian cancer which is somewhat suggestive of a hereditary cancer syndrome and predisposition to cancer. We, therefore, discussed and recommended the following at today's visit.   DISCUSSION: WE discussed that about 20% of women with ovarian cancer, and about 5-10% of women with breast cancer have it because of a hereditary cancer syndrome, most commonly BRCA mutations.  We discussed other hereditary conditions, such as Lynch syndrome, and others that can increase the risk for either breast or ovarian cancer or both.  Based on her mother and aunt no having cancer and living to an elderly age, we discussed that the risk for a BRCA mutation could be lower.  While Kelli Gould meets NCCN criteria for having genetic testing performed, she does not meet the criteria for genetic testing coverage by her insurance [employer].  We discussed out of pocket testing for $250.  Kelli Gould decided to move forward with out of pocket testing for the peace of mind she will get from results.  We reviewed the characteristics, features and inheritance patterns of hereditary cancer syndromes. We also discussed genetic testing, including the appropriate family members to test, the process of testing, insurance coverage and turn-around-time for results. We discussed the implications of a negative, positive and/or variant of uncertain significant result. We recommended Kelli Gould pursue genetic testing for the Common Hereditary cancer panel gene panel. The Hereditary Gene Panel offered by [facility] includes sequencing and/or deletion duplication testing of the following 43 genes: APC, ATM, AXIN2, BARD1, BMPR1A, BRCA1, BRCA2, BRIP1, CDH1, CDKN2A (p14ARF), CDKN2A (p16INK4a), CHEK2, DICER1, EPCAM (Deletion/duplication testing only), GREM1 (promoter region deletion/duplication testing only), KIT, MEN1, MLH1, MSH2, MSH6, MUTYH, NBN, NF1, PALB2, PDGFRA, PMS2, POLD1, POLE, PTEN, RAD50, RAD51C, RAD51D, SDHB,  SDHC, SDHD, SMAD4, SMARCA4. STK11, TP53, TSC1, TSC2, and VHL.  The following gene was evaluated for sequence changes only: SDHA and HOXB13 c.251G>A variant only.   PLAN: After considering the risks, benefits, and limitations, Kelli Gould  provided informed consent to pursue genetic testing and the blood sample was [facility] to [facility] for analysis of the Common Hereditary cancer panel. Results should be available within approximately 2-3 weeks' time, at which point they will be disclosed by telephone to Ms. Creech, as will any additional recommendations warranted by these results. Ms. Jeanmarie will receive a summary of her genetic counseling visit and a copy of her results once available. This information will also be available in Epic. We encouraged Ms. Cronin to remain in contact with cancer genetics annually so that we can continuously update the family history and inform her of any changes in cancer genetics  and testing that may be of benefit for her family. Ms. Hamre questions were answered to her satisfaction today. Our contact information was provided should additional questions or concerns arise.  Lastly, we encouraged Ms. Olivo to remain in contact with cancer genetics annually so that we can continuously update the family history and inform her of any changes in cancer genetics and testing that may be of benefit for this family.   Ms.  Vickroy questions were answered to her satisfaction today. Our contact information was provided should additional questions or concerns arise. Thank you for the referral and allowing [place] to share in the care of your patient.   Karen P. Florene Glen, [place], [facility] Certified Genetic Counselor [email or web address] phone: [phone]  The patient was seen for a total of 45 minutes in face-to-face genetic counseling.  This patient was discussed with Drs. Magrinat, Lindi Adie and/or Burr Medico who agrees with the above.     _______________________________________________________________________ For Office Staff:  Number of people involved in session: 1 Was an Intern/ student involved with case: no

## 2016-05-31 ENCOUNTER — Telehealth: Payer: Self-pay | Admitting: Genetic Counselor

## 2016-05-31 ENCOUNTER — Encounter: Payer: Self-pay | Admitting: Genetic Counselor

## 2016-05-31 DIAGNOSIS — Z1379 Encounter for other screening for genetic and chromosomal anomalies: Secondary | ICD-10-CM | POA: Insufficient documentation

## 2016-05-31 NOTE — Telephone Encounter (Signed)
Revealed negative genetic testing on the common hereditary cancer panel.  Discussed that this test does not tell us what is going on in the family, but that it is good news that we do not identify an inherited mutation in the genes that we looked at.

## 2016-06-04 ENCOUNTER — Ambulatory Visit: Payer: Self-pay | Admitting: Genetic Counselor

## 2016-06-04 DIAGNOSIS — Z803 Family history of malignant neoplasm of breast: Secondary | ICD-10-CM

## 2016-06-04 DIAGNOSIS — Z8041 Family history of malignant neoplasm of ovary: Secondary | ICD-10-CM

## 2016-06-04 DIAGNOSIS — Z1379 Encounter for other screening for genetic and chromosomal anomalies: Secondary | ICD-10-CM

## 2016-06-04 NOTE — Progress Notes (Signed)
HPI: Kelli Gould was previously seen in the [facility] due to a family history of cancer and concerns regarding a hereditary predisposition to cancer. Please refer to our prior cancer genetics clinic note for more information regarding Kelli Gould's medical, social and family histories, and our assessment and recommendations, at the time. Kelli Gould recent genetic test results were disclosed to her, as were recommendations warranted by these results. These results and recommendations are discussed in more detail below.  CANCER HISTORY:   No history exists.    FAMILY HISTORY:  We obtained a detailed, 4-generation family history.  Significant diagnoses are listed below: Family History  Problem Relation Age of Onset  . Heart disease Mother 28    Lived until 22  . Hypertension Mother 67  . Dementia Mother   . Crohn's disease Father   . Heart disease Maternal Aunt   . Ovarian cancer Maternal Grandmother 39  . Heart disease Maternal Grandfather   . Breast cancer Other     MGMs sister dx under 53  . Breast cancer Other     MFMs sister dx under 70  . Lung cancer Other     2 MGMs brothers with lung cancer  . Skin cancer Other     MGMs brother with skin cancer    The patient has two children who are cancer free.  She has a sister and brother who are both cancer free.  Her mother died at 59 from pneumonia and a potential PE.  She had one sister who died from heart problems. This sister had two children, one who has prostate cancer.  The patient's maternal grandmother had ovarian cancer at 23.  She had 12 siblings.  Two sisters had breast cancer under the age of 30, two brothers had lung cancer, one brother had skin cancer and was a farmer, and another brother had an unknown cancer.  The patient did not know her father.  He did not have cancer and died at 28.  There is no information about his side of the family.   Kelli Gould is unaware of previous family  history of genetic testing for hereditary cancer risks. Patient's maternal ancestors are of Caucasian descent, and paternal ancestors are of [place] descent. There is no reported Ashkenazi Jewish ancestry. There is no known consanguinity.  GENETIC TEST RESULTS: Genetic testing reported out on May 27, 2016 through the Common Hereditary cancer panel found no deleterious mutations.  The Hereditary Gene Panel offered by [facility] includes sequencing and/or deletion duplication testing of the following 43 genes: APC, ATM, AXIN2, BARD1, BMPR1A, BRCA1, BRCA2, BRIP1, CDH1, CDKN2A (p14ARF), CDKN2A (p16INK4a), CHEK2, DICER1, EPCAM (Deletion/duplication testing only), GREM1 (promoter region deletion/duplication testing only), KIT, MEN1, MLH1, MSH2, MSH6, MUTYH, NBN, NF1, PALB2, PDGFRA, PMS2, POLD1, POLE, PTEN, RAD50, RAD51C, RAD51D, SDHB, SDHC, SDHD, SMAD4, SMARCA4. STK11, TP53, TSC1, TSC2, and VHL.  The following gene was evaluated for sequence changes only: SDHA and HOXB13 c.251G>A variant only.  The test report has been scanned into EPIC and is located under the Molecular Pathology section of the Results Review tab.   We discussed with Kelli Gould that since the current genetic testing is not perfect, it is possible there may be a gene mutation in one of these genes that current testing cannot detect, but that chance is small. We also discussed, that it is possible that another gene that has not yet been discovered, or that we have not yet tested, is responsible for the cancer  diagnoses in the family, and it is, therefore, important to remain in touch with cancer genetics in the future so that we can continue to offer Kelli Gould the most up to date genetic testing.   CANCER SCREENING RECOMMENDATIONS: This normal result is reassuring and indicates that Kelli Gould does not likely have an increased risk of cancer due to a mutation in one of these genes.  We, therefore, recommended  Kelli Gould continue to follow  the cancer screening guidelines provided by her primary healthcare providers.   RECOMMENDATIONS FOR FAMILY MEMBERS: Women in this family might be at some increased risk of developing cancer, over the general population risk, simply due to the family history of cancer. We recommended women in this family have a yearly mammogram beginning at age 70, or 2 years younger than the earliest onset of cancer, an annual clinical breast exam, and perform monthly breast self-exams. Women in this family should also have a gynecological exam as recommended by their primary provider. All family members should have a colonoscopy by age 74.  FOLLOW-UP: Lastly, we discussed with Kelli Gould that cancer genetics is a rapidly advancing field and it is possible that new genetic tests will be appropriate for her and/or her family members in the future. We encouraged her to remain in contact with cancer genetics on an annual basis so we can update her personal and family histories and let her know of advances in cancer genetics that may benefit this family.   Our contact number was provided. Kelli Gould questions were answered to her satisfaction, and she knows she is welcome to call us at anytime with additional questions or concerns.   Kelli Kayser, MS, [facility] Certified Genetic Counselor [email or web address]

## 2016-07-18 ENCOUNTER — Encounter: Payer: Self-pay | Admitting: *Deleted

## 2016-08-15 ENCOUNTER — Other Ambulatory Visit: Payer: Self-pay | Admitting: Family Medicine

## 2016-08-16 NOTE — Telephone Encounter (Signed)
Faxed hardcopy for alprazolam to [facility] [place]

## 2016-08-16 NOTE — Telephone Encounter (Signed)
Requesting:  alprazolam Contract   None UDS   None Last OV    04/02/2016----no future Last Refill    #60 with 1 refill on 03/09/2015  Please Advise

## 2016-08-30 ENCOUNTER — Other Ambulatory Visit: Payer: Self-pay | Admitting: Family Medicine

## 2016-08-30 DIAGNOSIS — F411 Generalized anxiety disorder: Secondary | ICD-10-CM

## 2017-03-24 ENCOUNTER — Ambulatory Visit (INDEPENDENT_AMBULATORY_CARE_PROVIDER_SITE_OTHER): Payer: 59 | Admitting: Family Medicine

## 2017-03-24 ENCOUNTER — Encounter: Payer: Self-pay | Admitting: Family Medicine

## 2017-03-24 DIAGNOSIS — F411 Generalized anxiety disorder: Secondary | ICD-10-CM

## 2017-03-24 MED ORDER — ALPRAZOLAM 0.5 MG PO TABS: 0.5000 mg | ORAL_TABLET | Freq: Three times a day (TID) | ORAL | 2 refills | Status: DC | PRN

## 2017-03-24 MED ORDER — ESCITALOPRAM OXALATE 20 MG PO TABS: 20.0000 mg | ORAL_TABLET | Freq: Every day | ORAL | 3 refills | Status: DC

## 2017-03-24 NOTE — Patient Instructions (Signed)

## 2017-03-24 NOTE — Progress Notes (Signed)
Patient ID: Kelli Gould, female   DOB: [birth year], 61 y.o.   MRN: [id number]     Subjective:  I acted as a Neurosurgeonscribe for Dr. Zola ButtonLowne-Chase.  Apolonio SchneidersSheketia, CMA   Patient ID: Kelli BosJudy M Mannis, female    DOB: [birth year], 61 y.o.   MRN: [id number]  Chief Complaint  Patient presents with  . Anxiety    HPI  Patient is in today for follow up anxiety.  She lost her father 3 weeks ago. She has been feeling lost, more anxious.  Depression [facility] 03/24/2017 04/02/2016 06/23/2014  Decreased Interest 2 0 0  Down, Depressed, Hopeless 2 0 0  PHQ - 2 Score 4 0 0  Altered sleeping 2 - -  Tired, decreased energy 2 - -  Change in appetite 0 - -  Feeling bad or failure about yourself  0 - -  Trouble concentrating 2 - -  Moving slowly or fidgety/restless 2 - -  Suicidal thoughts 0 - -  PHQ-9 Score 12 - -  Difficult doing work/chores Not difficult at all - -     Patient Care Team: Zola ButtonLowne Chase, Grayling CongressYvonne R, DO as PCP - General (Family Medicine)   Past Medical History:  Diagnosis Date  . Anxiety   . Family history of breast cancer   . Family history of ovarian cancer     Past Surgical History:  Procedure Laterality Date  . APPENDECTOMY  1995  . BACK SURGERY     x's 3 days--Fusion on 2 diferent disk  . CHOLECYSTECTOMY  1994  . SPINE SURGERY  2004,05,09    microdiscectomy, fusion  L3-4, L5-6-- Dr Shelle IronBeane  . VAGINAL HYSTERECTOMY  1994   TAH-- endometriosis    Family History  Problem Relation Age of Onset  . Heart disease Mother 1565       Lived until 8684  . Hypertension Mother 2165  . Dementia Mother   . Crohn's disease Father   . Congenital heart disease Father   . COPD Father   . Heart disease Maternal Aunt   . Ovarian cancer Maternal Grandmother [id number]  . Heart disease Maternal Grandfather   . Breast cancer Other        MGMs sister dx under 50  . Breast cancer Other        MFMs sister dx under 50  . Lung cancer Other        2 MGMs brothers with lung cancer  . Skin cancer Other       MGMs brother with skin cancer    Social History   Socioeconomic History  . Marital status: Married    Spouse name: Not on file  . Number of children: Not on file  . Years of education: Not on file  . Highest education level: Not on file  Social Needs  . Financial resource strain: Not on file  . Food insecurity - worry: Not on file  . Food insecurity - inability: Not on file  . Transportation needs - medical: Not on file  . Transportation needs - non-medical: Not on file  Occupational History  . Not on file  Tobacco Use  . Smoking status: Never Smoker  . Smokeless tobacco: Never Used  Substance and Sexual Activity  . Alcohol use: No  . Drug use: No  . Sexual activity: Yes    Partners: Male  Other Topics Concern  . Not on file  Social History Narrative   Exercise--walk---not enough per pt    Outpatient  Medications Prior to Visit  Medication Sig Dispense Refill  . Biotin 10 MG CAPS Take 1 capsule by mouth daily.    . Cholecalciferol (VITAMIN D3) 2000 units capsule Take 1 capsule by mouth daily.    . traMADol (ULTRAM) 50 MG tablet Take 1 tablet (50 mg total) by mouth every 6 (six) hours as needed. 30 tablet 0  . ALPRAZolam (XANAX) 0.5 MG tablet TAKE 1 TABLET BY MOUTH 3 TIMES A DAY AS NEEDED 60 tablet 0  . escitalopram (LEXAPRO) 10 MG tablet TAKE 1 TABLET (10 MG TOTAL) BY MOUTH DAILY. 90 tablet 2  . dexlansoprazole (DEXILANT) 60 MG capsule Take 1 capsule (60 mg total) by mouth daily. 30 capsule 11   No facility-administered medications prior to visit.     No Known Allergies  Review of Systems  Constitutional: Negative for fever and malaise/fatigue.  HENT: Negative for congestion.   Eyes: Negative for blurred vision.  Respiratory: Negative for cough and shortness of breath.   Cardiovascular: Negative for chest pain, palpitations and leg swelling.  Gastrointestinal: Negative for vomiting.  Musculoskeletal: Negative for back pain.  Skin: Negative for rash.    Neurological: Negative for loss of consciousness and headaches.  Psychiatric/Behavioral: Positive for depression. The patient is nervous/anxious (anxious).        Objective:    Physical Exam  Constitutional: She is oriented to person, place, and time. She appears well-developed and well-nourished.  HENT:  Head: Normocephalic and atraumatic.  Eyes: Conjunctivae and EOM are normal.  Neck: Normal range of motion. Neck supple. No JVD present. Carotid bruit is not present. No thyromegaly present.  Cardiovascular: Normal rate, regular rhythm and normal heart sounds.  No murmur heard. Pulmonary/Chest: Effort normal and breath sounds normal. No respiratory distress. She has no wheezes. She has no rales. She exhibits no tenderness.  Musculoskeletal: She exhibits no edema.  Neurological: She is alert and oriented to person, place, and time.  Psychiatric: She has a normal mood and affect. Her behavior is normal. Judgment and thought content normal.  Nursing note and vitals reviewed.   BP 132/86 (BP Location: Left Arm, Cuff Size: Large)   Pulse 87   Temp 98.2 F (36.8 C) (Oral)   Resp 16   Ht 5\' 4"  (1.626 m)   Wt 222 lb 9.6 oz (101 kg)   SpO2 97%   BMI 38.21 kg/m  Wt Readings from Last 3 Encounters:  03/24/17 222 lb 9.6 oz (101 kg)  04/02/16 218 lb 9.6 oz (99.2 kg)  03/20/16 221 lb 9.6 oz (100.5 kg)   BP Readings from Last 3 Encounters:  03/24/17 132/86  04/04/16 146/69  04/02/16 (!) 150/82     Immunization History  Administered Date(s) Administered  . Influenza,inj,Quad PF,6+ Mos 02/21/2016  . Influenza-Unspecified 04/06/2011, 03/06/2014, 03/15/2015  . Tdap 07/18/2011    Health Maintenance  Topic Date Due  . PAP SMEAR  10/17/2015  . INFLUENZA VACCINE  12/04/2016  . MAMMOGRAM  04/16/2018  . TETANUS/TDAP  07/17/2021  . COLONOSCOPY  04/23/2026  . Hepatitis C Screening  Completed  . HIV Screening  Completed    Lab Results  Component Value Date   WBC 12.3 (H)  04/04/2016   HGB 13.5 04/04/2016   HCT 40.8 04/04/2016   PLT 248 04/04/2016   GLUCOSE 93 04/18/2016   CHOL 194 06/27/2015   TRIG 146.0 06/27/2015   HDL 38.50 (L) 06/27/2015   LDLDIRECT 138.8 10/16/2012   LDLCALC 126 (H) 06/27/2015   ALT 25 04/04/2016  AST 30 04/04/2016   NA 142 04/18/2016   K 4.3 04/18/2016   CL 107 04/18/2016   CREATININE 0.89 04/18/2016   BUN 16 04/18/2016   CO2 27 04/18/2016   TSH 2.69 06/27/2015   INR 0.9 05/08/2007   HGBA1C 5.7 04/18/2016    Lab Results  Component Value Date   TSH 2.69 06/27/2015   Lab Results  Component Value Date   WBC 12.3 (H) 04/04/2016   HGB 13.5 04/04/2016   HCT 40.8 04/04/2016   MCV 95.8 04/04/2016   PLT 248 04/04/2016   Lab Results  Component Value Date   NA 142 04/18/2016   K 4.3 04/18/2016   CO2 27 04/18/2016   GLUCOSE 93 04/18/2016   BUN 16 04/18/2016   CREATININE 0.89 04/18/2016   BILITOT 0.5 04/04/2016   ALKPHOS 108 04/04/2016   AST 30 04/04/2016   ALT 25 04/04/2016   PROT 7.1 04/04/2016   ALBUMIN 3.9 04/04/2016   CALCIUM 9.4 04/18/2016   ANIONGAP 8 04/04/2016   GFR 81.28 04/02/2016   Lab Results  Component Value Date   CHOL 194 06/27/2015   Lab Results  Component Value Date   HDL 38.50 (L) 06/27/2015   Lab Results  Component Value Date   LDLCALC 126 (H) 06/27/2015   Lab Results  Component Value Date   TRIG 146.0 06/27/2015   Lab Results  Component Value Date   CHOLHDL 5 06/27/2015   Lab Results  Component Value Date   HGBA1C 5.7 04/18/2016         Assessment & Plan:   Problem List Items Addressed This Visit    None    Visit Diagnoses    Generalized anxiety disorder       Relevant Medications   escitalopram (LEXAPRO) 20 MG tablet   ALPRAZolam (XANAX) 0.5 MG tablet    inc lexapro to 20 mg -- f/u 3 months or sooner prn   I have discontinued Jamae M. Iyer's dexlansoprazole and escitalopram. I have also changed her ALPRAZolam. Additionally, I am having her start on  escitalopram. Lastly, I am having her maintain her traMADol, Biotin, and Vitamin D3.  Meds ordered this encounter  Medications  . escitalopram (LEXAPRO) 20 MG tablet    Sig: Take 1 tablet (20 mg total) daily by mouth.    Dispense:  90 tablet    Refill:  3  . ALPRAZolam (XANAX) 0.5 MG tablet    Sig: Take 1 tablet (0.5 mg total) 3 (three) times daily as needed by mouth.    Dispense:  60 tablet    Refill:  2    This request is for a new prescription for a controlled substance as required by Federal/State law.    CMA served as Neurosurgeon during this visit. History, Physical and Plan performed by medical provider. Documentation and orders reviewed and attested to.  Donato Schultz, DO

## 2017-06-10 ENCOUNTER — Encounter: Payer: Self-pay | Admitting: Family Medicine

## 2017-06-10 ENCOUNTER — Ambulatory Visit (INDEPENDENT_AMBULATORY_CARE_PROVIDER_SITE_OTHER): Payer: 59 | Admitting: Family Medicine

## 2017-06-10 VITALS — BP 128/86 | HR 70 | Temp 97.9°F | Resp 16 | Ht 64.0 in | Wt 225.8 lb

## 2017-06-10 DIAGNOSIS — Z79899 Other long term (current) drug therapy: Secondary | ICD-10-CM

## 2017-06-10 DIAGNOSIS — F419 Anxiety disorder, unspecified: Secondary | ICD-10-CM

## 2017-06-10 DIAGNOSIS — M158 Other polyosteoarthritis: Secondary | ICD-10-CM

## 2017-06-10 DIAGNOSIS — G4452 New daily persistent headache (NDPH): Secondary | ICD-10-CM

## 2017-06-10 DIAGNOSIS — G43809 Other migraine, not intractable, without status migrainosus: Secondary | ICD-10-CM

## 2017-06-10 LAB — CBC WITH DIFFERENTIAL/PLATELET
Basophils Absolute: 0 10*3/uL (ref 0.0–0.1)
Basophils Relative: 0.5 % (ref 0.0–3.0)
EOS PCT: 1.1 % (ref 0.0–5.0)
Eosinophils Absolute: 0.1 10*3/uL (ref 0.0–0.7)
HCT: 41.1 % (ref 36.0–46.0)
HEMOGLOBIN: 13.6 g/dL (ref 12.0–15.0)
Lymphocytes Relative: 23.6 % (ref 12.0–46.0)
Lymphs Abs: 1.8 10*3/uL (ref 0.7–4.0)
MCHC: 32.9 g/dL (ref 30.0–36.0)
MCV: 96 fl (ref 78.0–100.0)
MONO ABS: 0.4 10*3/uL (ref 0.1–1.0)
Monocytes Relative: 5.6 % (ref 3.0–12.0)
Neutro Abs: 5.4 10*3/uL (ref 1.4–7.7)
Neutrophils Relative %: 69.2 % (ref 43.0–77.0)
Platelets: 275 10*3/uL (ref 150.0–400.0)
RBC: 4.29 Mil/uL (ref 3.87–5.11)
RDW: 12.6 % (ref 11.5–15.5)
WBC: 7.7 10*3/uL (ref 4.0–10.5)

## 2017-06-10 LAB — COMPREHENSIVE METABOLIC PANEL
ALBUMIN: 4.3 g/dL (ref 3.5–5.2)
ALK PHOS: 118 U/L — AB (ref 39–117)
ALT: 14 U/L (ref 0–35)
AST: 20 U/L (ref 0–37)
BILIRUBIN TOTAL: 0.4 mg/dL (ref 0.2–1.2)
BUN: 15 mg/dL (ref 6–23)
CO2: 29 mEq/L (ref 19–32)
CREATININE: 0.74 mg/dL (ref 0.40–1.20)
Calcium: 9.6 mg/dL (ref 8.4–10.5)
Chloride: 105 mEq/L (ref 96–112)
GFR: 84.76 mL/min (ref >60.00)
Glucose, Bld: 100 mg/dL — ABNORMAL HIGH (ref 70–99)
POTASSIUM: 4.2 meq/L (ref 3.5–5.1)
SODIUM: 140 meq/L (ref 135–145)
TOTAL PROTEIN: 7.5 g/dL (ref 6.0–8.3)

## 2017-06-10 LAB — SEDIMENTATION RATE: SED RATE: 13 mm/h (ref 0–30)

## 2017-06-10 MED ORDER — TRAMADOL HCL 50 MG PO TABS: 50.0000 mg | ORAL_TABLET | Freq: Four times a day (QID) | ORAL | 0 refills | Status: DC | PRN

## 2017-06-10 NOTE — Patient Instructions (Signed)

## 2017-06-10 NOTE — Progress Notes (Signed)
Patient ID: Kelli Gould, female   DOB: 1955-12-29, 62 y.o.   MRN: [id number]    Subjective:  I acted as a NeurosurgeonJudye Gould for Dr. Zola Gould.  Kelli Gould, CMA   Patient ID: Kelli BosJudy M Gould, female    DOB: 1955-12-29, 62 y.o.   MRN: [id number]  Chief Complaint  Patient presents with  . Headache    Headache   This is a new problem. Episode onset: one month. The problem occurs daily. Radiates to: eyes. The quality of the pain is described as squeezing. Associated symptoms include nausea and neck pain. Pertinent negatives include no abdominal pain, coughing, ear pain, eye pain, eye redness, eye watering, insomnia, loss of balance, muscle aches, phonophobia, photophobia, rhinorrhea, sinus pressure, sore throat, visual change or vomiting. Associated symptoms comments: Light headed . She has tried NSAIDs (ibuprofen ) for the symptoms. The treatment provided mild relief.   pt just had vision checked and there was no problems.  Patient is in today for headache. Headaches occurring daily x 1 month.  Around this time she also dec her lexapro to 10 mg because it was making her not care about anything.  She is better now but headaches are worse.    Patient Care Team: Kelli Gould, Kelli CongressYvonne R, DO as PCP - General (Family Medicine)   Past Medical History:  Diagnosis Date  . Anxiety   . Family history of breast cancer   . Family history of ovarian cancer     Past Surgical History:  Procedure Laterality Date  . APPENDECTOMY  1995  . BACK SURGERY     x's 3 days--Fusion on 2 diferent disk  . CHOLECYSTECTOMY  1994  . SPINE SURGERY  2004,05,09    microdiscectomy, fusion  L3-4, L5-6-- Dr Shelle IronBeane  . VAGINAL HYSTERECTOMY  1994   TAH-- endometriosis    Family History  Problem Relation Age of Onset  . Heart disease Mother 9365       Lived until 8384  . Hypertension Mother 7165  . Dementia Mother   . Crohn's disease Father   . Congenital heart disease Father   . COPD Father   . Heart disease Maternal Aunt   .  Ovarian cancer Maternal Grandmother 3556  . Heart disease Maternal Grandfather   . Breast cancer Other        MGMs sister dx under 50  . Breast cancer Other        MFMs sister dx under 50  . Lung cancer Other        2 MGMs brothers with lung cancer  . Skin cancer Other        MGMs brother with skin cancer    Social History   Socioeconomic History  . Marital status: Married    Spouse name: Not on file  . Number of children: Not on file  . Years of education: Not on file  . Highest education level: Not on file  Social Needs  . Financial resource strain: Not on file  . Food insecurity - worry: Not on file  . Food insecurity - inability: Not on file  . Transportation needs - medical: Not on file  . Transportation needs - non-medical: Not on file  Occupational History  . Not on file  Tobacco Use  . Smoking status: Never Smoker  . Smokeless tobacco: Never Used  Substance and Sexual Activity  . Alcohol use: No  . Drug use: No  . Sexual activity: Yes    Partners: Male  Other  Topics Concern  . Not on file  Social History Narrative   Exercise--walk---not enough per pt    Outpatient Medications Prior to Visit  Medication Sig Dispense Refill  . ALPRAZolam (XANAX) 0.5 MG tablet Take 1 tablet (0.5 mg total) 3 (three) times daily as needed by mouth. 60 tablet 2  . escitalopram (LEXAPRO) 20 MG tablet Take 1 tablet (20 mg total) daily by mouth. 90 tablet 3  . traMADol (ULTRAM) 50 MG tablet Take 1 tablet (50 mg total) by mouth every 6 (six) hours as needed. 30 tablet 0  . Biotin 10 MG CAPS Take 1 capsule by mouth daily.    . Cholecalciferol (VITAMIN D3) 2000 units capsule Take 1 capsule by mouth daily.     No facility-administered medications prior to visit.     No Known Allergies  Review of Systems  HENT: Negative for ear pain, rhinorrhea, sinus pressure and sore throat.   Eyes: Negative for photophobia, pain and redness.  Respiratory: Negative for cough.   Gastrointestinal:  Positive for nausea. Negative for abdominal pain and vomiting.  Musculoskeletal: Positive for neck pain.  Neurological: Positive for headaches. Negative for loss of balance.  Psychiatric/Behavioral: The patient does not have insomnia.        Objective:    Physical Exam  Constitutional: She is oriented to person, place, and time. She appears well-developed and well-nourished.  HENT:  Head: Normocephalic and atraumatic.  Eyes: Conjunctivae and EOM are normal.  Neck: Normal range of motion. Neck supple. No JVD present. Carotid bruit is not present. No thyromegaly present.  Cardiovascular: Normal rate, regular rhythm and normal heart sounds.  No murmur heard. Pulmonary/Chest: Effort normal and breath sounds normal. No respiratory distress. She has no wheezes. She has no rales. She exhibits no tenderness.  Musculoskeletal: She exhibits no edema.  Neurological: She is alert and oriented to person, place, and time. She displays normal reflexes. No cranial nerve deficit. She exhibits normal muscle tone. Coordination normal.  Psychiatric: She has a normal mood and affect.  Nursing note and vitals reviewed.   BP 128/86 (BP Location: Left Arm, Cuff Size: Large)   Pulse 70   Temp 97.9 F (36.6 C) (Oral)   Resp 16   Ht 5\' 4"  (1.626 m)   Wt 225 lb 12.8 oz (102.4 kg)   SpO2 96%   BMI 38.76 kg/m  Wt Readings from Last 3 Encounters:  06/10/17 225 lb 12.8 oz (102.4 kg)  03/24/17 222 lb 9.6 oz (101 kg)  04/02/16 218 lb 9.6 oz (99.2 kg)   BP Readings from Last 3 Encounters:  06/10/17 128/86  03/24/17 132/86  04/04/16 146/69     Immunization History  Administered Date(s) Administered  . Influenza,inj,Quad PF,6+ Mos 02/21/2016  . Influenza-Unspecified 04/06/2011, 03/06/2014, 03/15/2015  . Tdap 07/18/2011    Health Maintenance  Topic Date Due  . PAP SMEAR  10/17/2015  . MAMMOGRAM  04/16/2018  . TETANUS/TDAP  07/17/2021  . COLONOSCOPY  04/23/2026  . INFLUENZA VACCINE  Completed  .  Hepatitis C Screening  Completed  . HIV Screening  Completed    Lab Results  Component Value Date   WBC 12.3 (H) 04/04/2016   HGB 13.5 04/04/2016   HCT 40.8 04/04/2016   PLT 248 04/04/2016   GLUCOSE 93 04/18/2016   CHOL 194 06/27/2015   TRIG 146.0 06/27/2015   HDL 38.50 (L) 06/27/2015   LDLDIRECT 138.8 10/16/2012   LDLCALC 126 (H) 06/27/2015   ALT 25 04/04/2016   AST  30 04/04/2016   NA 142 04/18/2016   K 4.3 04/18/2016   CL 107 04/18/2016   CREATININE 0.89 04/18/2016   BUN 16 04/18/2016   CO2 27 04/18/2016   TSH 2.69 06/27/2015   INR 0.9 05/08/2007   HGBA1C 5.7 04/18/2016    Lab Results  Component Value Date   TSH 2.69 06/27/2015   Lab Results  Component Value Date   WBC 12.3 (H) 04/04/2016   HGB 13.5 04/04/2016   HCT 40.8 04/04/2016   MCV 95.8 04/04/2016   PLT 248 04/04/2016   Lab Results  Component Value Date   NA 142 04/18/2016   K 4.3 04/18/2016   CO2 27 04/18/2016   GLUCOSE 93 04/18/2016   BUN 16 04/18/2016   CREATININE 0.89 04/18/2016   BILITOT 0.5 04/04/2016   ALKPHOS 108 04/04/2016   AST 30 04/04/2016   ALT 25 04/04/2016   PROT 7.1 04/04/2016   ALBUMIN 3.9 04/04/2016   CALCIUM 9.4 04/18/2016   ANIONGAP 8 04/04/2016   GFR 81.28 04/02/2016   Lab Results  Component Value Date   CHOL 194 06/27/2015   Lab Results  Component Value Date   HDL 38.50 (L) 06/27/2015   Lab Results  Component Value Date   LDLCALC 126 (H) 06/27/2015   Lab Results  Component Value Date   TRIG 146.0 06/27/2015   Lab Results  Component Value Date   CHOLHDL 5 06/27/2015   Lab Results  Component Value Date   HGBA1C 5.7 04/18/2016         Assessment & Plan:   Problem List Items Addressed This Visit      Unprioritized   Anxiety - Primary   Relevant Orders   Pain Mgmt, Profile 8 w/Conf, U    Other Visit Diagnoses    High risk medication use       Relevant Orders   Pain Mgmt, Profile 8 w/Conf, U   Other migraine without status migrainosus, not  intractable       Relevant Medications   traMADol (ULTRAM) 50 MG tablet   Other Relevant Orders   CBC with Differential/Platelet   Comprehensive metabolic panel   Sedimentation rate   New daily persistent headache       Relevant Medications   traMADol (ULTRAM) 50 MG tablet   Other Relevant Orders   CT Head Wo Contrast   Other osteoarthritis involving multiple joints       Relevant Medications   traMADol (ULTRAM) 50 MG tablet      I have discontinued Bonnita Levan. Tooley's Biotin and Vitamin D3. I am also having her maintain her escitalopram, ALPRAZolam, and traMADol.  Meds ordered this encounter  Medications  . DISCONTD: traMADol (ULTRAM) 50 MG tablet    Sig: Take 1 tablet (50 mg total) by mouth every 6 (six) hours as needed.    Dispense:  30 tablet    Refill:  0  . traMADol (ULTRAM) 50 MG tablet    Sig: Take 1 tablet (50 mg total) by mouth every 6 (six) hours as needed.    Dispense:  30 tablet    Refill:  0    CMA served as scribe during this visit. History, Physical and Plan performed by medical provider. Documentation and orders reviewed and attested to.  Donato Schultz, DO

## 2017-06-13 ENCOUNTER — Other Ambulatory Visit: Payer: Self-pay | Admitting: Family Medicine

## 2017-06-13 DIAGNOSIS — F411 Generalized anxiety disorder: Secondary | ICD-10-CM

## 2017-06-17 LAB — TEST AUTHORIZATION

## 2017-06-17 LAB — PAIN MGMT, PROFILE 8 W/CONF, U
6 ACETYLMORPHINE: NEGATIVE ng/mL (ref <10)
AMPHETAMINES: NEGATIVE ng/mL (ref <500)
Alcohol Metabolites: NEGATIVE ng/mL (ref <500)
BUPRENORPHINE, URINE: NEGATIVE ng/mL (ref <5)
Benzodiazepines: NEGATIVE ng/mL (ref <100)
Cocaine Metabolite: NEGATIVE ng/mL (ref <150)
Creatinine: 55.6 mg/dL
MARIJUANA METABOLITE: NEGATIVE ng/mL (ref <20)
MDMA: NEGATIVE ng/mL (ref <500)
Opiates: NEGATIVE ng/mL (ref <100)
Oxidant: NEGATIVE ug/mL (ref <200)
Oxycodone: NEGATIVE ng/mL (ref <100)
PH: 5.09 (ref 4.5–9.0)

## 2017-06-17 LAB — PAIN MGMT, TRAMADOL W/MEDMATCH, U
Desmethyltramadol: NEGATIVE ng/mL (ref <100)
TRAMADOL: NEGATIVE ng/mL (ref <100)

## 2017-06-20 ENCOUNTER — Ambulatory Visit
Admission: RE | Admit: 2017-06-20 | Discharge: 2017-06-20 | Disposition: A | Discharge status: Home / Self Care | Payer: 59 | Source: Ambulatory Visit | Attending: Family Medicine | Admitting: Family Medicine

## 2017-06-20 DIAGNOSIS — G4452 New daily persistent headache (NDPH): Secondary | ICD-10-CM

## 2017-06-23 ENCOUNTER — Ambulatory Visit: Payer: Self-pay | Admitting: *Deleted

## 2017-06-23 ENCOUNTER — Ambulatory Visit (INDEPENDENT_AMBULATORY_CARE_PROVIDER_SITE_OTHER): Payer: 59 | Admitting: Family Medicine

## 2017-06-23 ENCOUNTER — Encounter: Payer: Self-pay | Admitting: Family Medicine

## 2017-06-23 VITALS — BP 136/86 | HR 74 | Temp 97.5°F | Resp 16 | Ht 64.0 in | Wt 224.0 lb

## 2017-06-23 DIAGNOSIS — G4489 Other headache syndrome: Secondary | ICD-10-CM | POA: Diagnosis not present

## 2017-06-23 DIAGNOSIS — J324 Chronic pansinusitis: Secondary | ICD-10-CM

## 2017-06-23 DIAGNOSIS — M158 Other polyosteoarthritis: Secondary | ICD-10-CM | POA: Diagnosis not present

## 2017-06-23 MED ORDER — FLUTICASONE PROPIONATE 50 MCG/ACT NA SUSP: 2.0000 | Freq: Every day | NASAL | 6 refills | Status: DC

## 2017-06-23 MED ORDER — TRAMADOL HCL 50 MG PO TABS: 50.0000 mg | ORAL_TABLET | Freq: Four times a day (QID) | ORAL | 0 refills | Status: DC | PRN

## 2017-06-23 MED ORDER — KETOROLAC TROMETHAMINE 60 MG/2ML IM SOLN
60.0000 mg | Freq: Once | INTRAMUSCULAR | Status: AC
  Administered 2017-06-23: 60 mg via INTRAMUSCULAR

## 2017-06-23 MED ORDER — AMOXICILLIN-POT CLAVULANATE 875-125 MG PO TABS: 1.0000 | ORAL_TABLET | Freq: Two times a day (BID) | ORAL | 0 refills | Status: DC

## 2017-06-23 NOTE — Telephone Encounter (Signed)
FYI

## 2017-06-23 NOTE — Telephone Encounter (Signed)
Called in c/o of continued headache of left side of face and neck and ear.   This started Friday.   She was recently diagnosed with a migraine  Back in January which she has never had before.   Over the weekend she is getting worse.   The top of her head is tender and sore.   She also mentioned she gets dizzy when she first lays down but it goes away after a few minutes. She had a CT scan on Friday but does not know the results of that yet.  The agent made her an appt for today at 2:15 with Dr. Zola ButtonLowne Chase. I went over the s/s to look for and call 911 for if she gets worse per the care advise protocol.   She verbalized understanding.  Reason for Disposition . [1] New headache AND [2] age > 3750  Answer Assessment - Initial Assessment Questions 1. LOCATION: "Where does it hurt?"      Left side face and neck also pain in your left ear.   Your neck on left side feels puffy and different than the other side.  This is a new pain that started Friday.   It continued Saturday and Sunday.   I'm using a heating pad that helps some. 2. ONSET: "When did the headache start?" (Minutes, hours or days)      First of January. 3. PATTERN: "Does the pain come and go, or has it been constant since it started?"     I had a CT scan on Fri.   I'm using Tramadol regularly.   I makes it ease up but does not make it go away.   I don't like taking medicine. 4. SEVERITY: "How bad is the pain?" and "What does it keep you from doing?"  (e.g., Scale 1-10; mild, moderate, or severe)   - MILD (1-3): doesn't interfere with normal activities    - MODERATE (4-7): interferes with normal activities or awakens from sleep    - SEVERE (8-10): excruciating pain, unable to do any normal activities         5. RECURRENT SYMPTOM: "Have you ever had headaches before?" If so, ask: "When was the last time?" and "What happened that time?"      No.  This all started back in Jan. 6. CAUSE: "What do you think is causing the headache?"     No  I  really don't know.   The top of my head is so sore and tender. 7. MIGRAINE: "Have you been diagnosed with migraine headaches?" If so, ask: "Is this headache similar?"      Just recently they think it's a migraine but not sure. 8. HEAD INJURY: "Has there been any recent injury to the head?"      No   My father recently.  I don't know if it's stress. 9. OTHER SYMPTOMS: "Do you have any other symptoms?" (fever, stiff neck, eye pain, sore throat, cold symptoms)     No other symptoms except what is listed above. 10. PREGNANCY: "Is there any chance you are pregnant?" "When was your last menstrual period?"       No  Protocols used: HEADACHE-A-AH

## 2017-06-23 NOTE — Patient Instructions (Signed)
Sinus Headache A sinus headache occurs when the paranasal sinuses become clogged or swollen. Paranasal sinuses are air pockets within the bones of the face. Sinus headaches can range from mild to severe. What are the causes? A sinus headache can result from various conditions that affect the sinuses, such as:  Colds.  Sinus infections.  Allergies.  What are the signs or symptoms? The main symptom of this condition is a headache that may feel like pain or pressure in the face, forehead, ears, or upper teeth. People who have a sinus headache often have other symptoms, such as:  Congested or runny nose.  Fever.  Inability to smell.  Weather changes can make symptoms worse. How is this diagnosed? This condition may be diagnosed based on:  A physical exam and medical history.  Imaging tests, such as a CT scan and MRI, to check for problems with the sinuses.  A specialist may look into the sinuses with a tool that has a camera (endoscopy).  How is this treated? Treatment for this condition depends on the cause.  Sinus pain that is caused by a sinus infection may be treated with antibiotic medicine.  Sinus pain that is caused by allergies may be helped by allergy medicines (antihistamines) and medicated nasal sprays.  Sinus pain that is caused by congestion may be helped by flushing the nose and sinuses with saline solution.  Follow these instructions at home:  Take medicines only as directed by your health care provider.  If you were prescribed an antibiotic medicine, finish all of it even if you start to feel better.  If you have congestion, use a nasal spray to help reduce pressure.  If directed, apply a warm, moist washcloth to your face to help relieve pain. Contact a health care provider if:  You have headaches more than one time each week.  You have sensitivity to light or sound.  You have a fever.  You feel sick to your stomach (nauseous) or you throw up  (vomit).  Your headaches do not get better with treatment. Many people think that they have a sinus headache when they actually have migraines or tension headaches. Get help right away if:  You have vision problems.  You have sudden, severe pain in your face or head.  You have a seizure.  You are confused.  You have a stiff neck. This information is not intended to replace advice given to you by your health care provider. Make sure you discuss any questions you have with your health care provider. Document Released: 05/30/2004 Document Revised: 12/17/2015 Document Reviewed: 04/18/2014 Elsevier Interactive Patient Education  2018 Elsevier Inc.  

## 2017-06-23 NOTE — Progress Notes (Signed)
Patient ID: Kelli Gould Razzano, female   DOB: 07-15-1955, 62 y.o.   MRN: [id number]    Subjective:  I acted as a Neurosurgeonscribe for Dr. Zola ButtonLowne-Chase.  Apolonio SchneidersSheketia, CMA   Patient ID: Kelli Gould, female    DOB: 07-15-1955, 62 y.o.   MRN: [id number]  Chief Complaint  Patient presents with  . Headache  . neck pain and swelling    HPI  Patient is in today for headaches and left sided neck pain and swelling.  Headache started around 05/13/17.  Had a CT scan on Friday.  The headaches are getting worse.  Neck pain on left side and pain on left side of face that started Friday.  She also has left ear pain.  She was asked by nurse on phone has she ever fell and she remembered after speaking with nurse that she tripped and fell over dog.    Patient Care Team: Zola ButtonLowne Chase, Grayling CongressYvonne R, DO as PCP - General (Family Medicine)   Past Medical History:  Diagnosis Date  . Anxiety   . Family history of breast cancer   . Family history of ovarian cancer     Past Surgical History:  Procedure Laterality Date  . APPENDECTOMY  1995  . BACK SURGERY     x's 3 days--Fusion on 2 diferent disk  . CHOLECYSTECTOMY  1994  . SPINE SURGERY  2004,05,09    microdiscectomy, fusion  L3-4, L5-6-- Dr Shelle IronBeane  . VAGINAL HYSTERECTOMY  1994   TAH-- endometriosis    Family History  Problem Relation Age of Onset  . Heart disease Mother 3265       Lived until 2484  . Hypertension Mother 3765  . Dementia Mother   . Crohn's disease Father   . Congenital heart disease Father   . COPD Father   . Heart disease Maternal Aunt   . Ovarian cancer Maternal Grandmother [id number]  . Heart disease Maternal Grandfather   . Breast cancer Other        MGMs sister dx under 50  . Breast cancer Other        MFMs sister dx under 50  . Lung cancer Other        2 MGMs brothers with lung cancer  . Skin cancer Other        MGMs brother with skin cancer    Social History   Socioeconomic History  . Marital status: Married    Spouse name: Not on file  .  Number of children: Not on file  . Years of education: Not on file  . Highest education level: Not on file  Social Needs  . Financial resource strain: Not on file  . Food insecurity - worry: Not on file  . Food insecurity - inability: Not on file  . Transportation needs - medical: Not on file  . Transportation needs - non-medical: Not on file  Occupational History  . Not on file  Tobacco Use  . Smoking status: Never Smoker  . Smokeless tobacco: Never Used  Substance and Sexual Activity  . Alcohol use: No  . Drug use: No  . Sexual activity: Yes    Partners: Male  Other Topics Concern  . Not on file  Social History Narrative   Exercise--walk---not enough per pt    Outpatient Medications Prior to Visit  Medication Sig Dispense Refill  . ALPRAZolam (XANAX) 0.5 MG tablet Take 1 tablet (0.5 mg total) 3 (three) times daily as needed by mouth. 60 tablet 2  .  escitalopram (LEXAPRO) 10 MG tablet Take 10 mg by mouth daily.    Marland Kitchen escitalopram (LEXAPRO) 20 MG tablet Take 1 tablet (20 mg total) daily by mouth. 90 tablet 3  . traMADol (ULTRAM) 50 MG tablet Take 1 tablet (50 mg total) by mouth every 6 (six) hours as needed. 30 tablet 0   No facility-administered medications prior to visit.     No Known Allergies  Review of Systems  Constitutional: Negative for fever and malaise/fatigue.  HENT: Positive for ear pain (left). Negative for congestion.   Eyes: Negative for blurred vision.  Respiratory: Negative for cough and shortness of breath.   Cardiovascular: Negative for chest pain, palpitations and leg swelling.  Gastrointestinal: Negative for vomiting.  Musculoskeletal: Positive for neck pain. Negative for back pain.  Skin: Negative for rash.  Neurological: Positive for headaches. Negative for loss of consciousness.       Objective:    Physical Exam  Constitutional: She is oriented to person, place, and time. She appears well-developed and well-nourished.  HENT:  Right Ear:  External ear normal.  Left Ear: External ear normal.  + PND + errythema  Eyes: Conjunctivae are normal. Right eye exhibits no discharge. Left eye exhibits no discharge.  Cardiovascular: Normal rate, regular rhythm and normal heart sounds.  No murmur heard. Pulmonary/Chest: Effort normal and breath sounds normal. No respiratory distress. She has no wheezes. She has no rales. She exhibits no tenderness.  Musculoskeletal: She exhibits no edema.  Lymphadenopathy:    She has no cervical adenopathy.  Neurological: She is alert and oriented to person, place, and time.  Nursing note and vitals reviewed.   BP 136/86 (BP Location: Left Arm, Cuff Size: Large)   Pulse 74   Temp (!) 97.5 F (36.4 C) (Oral)   Resp 16   Ht 5\' 4"  (1.626 Gould)   Wt 224 lb (101.6 kg)   SpO2 95%   BMI 38.45 kg/Gould  Wt Readings from Last 3 Encounters:  06/23/17 224 lb (101.6 kg)  06/10/17 225 lb 12.8 oz (102.4 kg)  03/24/17 222 lb 9.6 oz (101 kg)   BP Readings from Last 3 Encounters:  06/23/17 136/86  06/10/17 128/86  03/24/17 132/86     Immunization History  Administered Date(s) Administered  . Influenza,inj,Quad PF,6+ Mos 02/21/2016  . Influenza-Unspecified 04/06/2011, 03/06/2014, 03/15/2015  . Tdap 07/18/2011    Health Maintenance  Topic Date Due  . PAP SMEAR  10/17/2015  . MAMMOGRAM  04/16/2018  . TETANUS/TDAP  07/17/2021  . COLONOSCOPY  04/23/2026  . INFLUENZA VACCINE  Completed  . Hepatitis C Screening  Completed  . HIV Screening  Completed    Lab Results  Component Value Date   WBC 7.7 06/10/2017   HGB 13.6 06/10/2017   HCT 41.1 06/10/2017   PLT 275.0 06/10/2017   GLUCOSE 100 (H) 06/10/2017   CHOL 194 06/27/2015   TRIG 146.0 06/27/2015   HDL 38.50 (L) 06/27/2015   LDLDIRECT 138.8 10/16/2012   LDLCALC 126 (H) 06/27/2015   ALT 14 06/10/2017   AST 20 06/10/2017   NA 140 06/10/2017   K 4.2 06/10/2017   CL 105 06/10/2017   CREATININE 0.74 06/10/2017   BUN 15 06/10/2017   CO2 29  06/10/2017   TSH 2.69 06/27/2015   INR 0.9 05/08/2007   HGBA1C 5.7 04/18/2016    Lab Results  Component Value Date   TSH 2.69 06/27/2015   Lab Results  Component Value Date   WBC 7.7 06/10/2017  HGB 13.6 06/10/2017   HCT 41.1 06/10/2017   MCV 96.0 06/10/2017   PLT 275.0 06/10/2017   Lab Results  Component Value Date   NA 140 06/10/2017   K 4.2 06/10/2017   CO2 29 06/10/2017   GLUCOSE 100 (H) 06/10/2017   BUN 15 06/10/2017   CREATININE 0.74 06/10/2017   BILITOT 0.4 06/10/2017   ALKPHOS 118 (H) 06/10/2017   AST 20 06/10/2017   ALT 14 06/10/2017   PROT 7.5 06/10/2017   ALBUMIN 4.3 06/10/2017   CALCIUM 9.6 06/10/2017   ANIONGAP 8 04/04/2016   GFR 84.76 06/10/2017   Lab Results  Component Value Date   CHOL 194 06/27/2015   Lab Results  Component Value Date   HDL 38.50 (L) 06/27/2015   Lab Results  Component Value Date   LDLCALC 126 (H) 06/27/2015   Lab Results  Component Value Date   TRIG 146.0 06/27/2015   Lab Results  Component Value Date   CHOLHDL 5 06/27/2015   Lab Results  Component Value Date   HGBA1C 5.7 04/18/2016         Assessment & Plan:   Problem List Items Addressed This Visit    None    Visit Diagnoses    Pansinusitis, unspecified chronicity    -  Primary   Relevant Medications   amoxicillin-clavulanate (AUGMENTIN) 875-125 MG tablet   fluticasone (FLONASE) 50 MCG/ACT nasal spray   Other headache syndrome       Relevant Medications   escitalopram (LEXAPRO) 10 MG tablet   traMADol (ULTRAM) 50 MG tablet   ketorolac (TORADOL) injection 60 mg (Completed)   Other Relevant Orders   MR Brain Wo Contrast   Other osteoarthritis involving multiple joints       Relevant Medications   traMADol (ULTRAM) 50 MG tablet   ketorolac (TORADOL) injection 60 mg (Completed)      I am having Bonnita Levan. Burgert start on amoxicillin-clavulanate and fluticasone. I am also having her maintain her ALPRAZolam, escitalopram, and traMADol. We  administered ketorolac.  Meds ordered this encounter  Medications  . amoxicillin-clavulanate (AUGMENTIN) 875-125 MG tablet    Sig: Take 1 tablet by mouth 2 (two) times daily.    Dispense:  20 tablet    Refill:  0  . fluticasone (FLONASE) 50 MCG/ACT nasal spray    Sig: Place 2 sprays into both nostrils daily.    Dispense:  16 g    Refill:  6  . traMADol (ULTRAM) 50 MG tablet    Sig: Take 1 tablet (50 mg total) by mouth every 6 (six) hours as needed.    Dispense:  30 tablet    Refill:  0  . ketorolac (TORADOL) injection 60 mg    CMA served as scribe during this visit. History, Physical and Plan performed by medical provider. Documentation and orders reviewed and attested to.  Donato Schultz, DO

## 2017-06-24 ENCOUNTER — Ambulatory Visit: Payer: Managed Care, Other (non HMO) | Admitting: Family Medicine

## 2017-06-24 ENCOUNTER — Telehealth: Payer: Self-pay | Admitting: *Deleted

## 2017-06-24 ENCOUNTER — Other Ambulatory Visit: Payer: Self-pay | Admitting: *Deleted

## 2017-06-24 DIAGNOSIS — G4489 Other headache syndrome: Secondary | ICD-10-CM

## 2017-06-24 NOTE — Telephone Encounter (Signed)
MRI was not approved thru insurance.  Per Dr. Laury AxonLowne refer to neurologist.  Referral placed.  Left detailed message on machine.

## 2017-06-27 ENCOUNTER — Ambulatory Visit: Payer: 59 | Admitting: Family Medicine

## 2017-07-01 ENCOUNTER — Encounter: Payer: Self-pay | Admitting: Neurology

## 2017-07-01 ENCOUNTER — Ambulatory Visit (INDEPENDENT_AMBULATORY_CARE_PROVIDER_SITE_OTHER): Payer: 59 | Admitting: Neurology

## 2017-07-01 VITALS — BP 120/90 | HR 71 | Ht 64.0 in | Wt 224.5 lb

## 2017-07-01 DIAGNOSIS — R519 Headache, unspecified: Secondary | ICD-10-CM

## 2017-07-01 DIAGNOSIS — G500 Trigeminal neuralgia: Secondary | ICD-10-CM

## 2017-07-01 DIAGNOSIS — R51 Headache: Secondary | ICD-10-CM

## 2017-07-01 NOTE — Patient Instructions (Addendum)
1.  We will check MRI of brain with and without contrast including attention to the left trigeminal nerve (which may cause facial pain).  Further recommendations, including treatment, pending results. We have sent a referral to [facility] for your MRI and they will call you directly to schedule your appt. They are located at [address] If you need to contact them directly please call [phone].   2.  In the meantime, I want you to see your dentist to make sure you don't have TMJ or possible infection in the tooth that could cause facial/jaw/ear/neck pain and puffiness.

## 2017-07-01 NOTE — Progress Notes (Signed)
NEUROLOGY CONSULTATION NOTE  Kelli Gould MRN: [id number] DOB: [birth year]  Referring provider: Dr. Zola Button Primary care provider: Dr. Zola Button  Reason for consult:  headache  HISTORY OF PRESENT ILLNESS: Kelli Gould is a 62 year old female with anxiety who presents for headache.  History supplemented by PCP note.  Onset of headache started in early January 2019.  She initially had diffuse headache which is now left sided.  She describes left sided pain of the head, ear, face, jaw and left side of neck.  It is a constant moderate pressure-like pain but fluctuates to severe about twice a day for an hour at a time.  There is no associated thunderclap headache.  She reports some mild puffiness on the left side of her cheek but no erythema.  She notes that her left eye sometimes waters but denies ptosis, pupil asymmetry and conjunctiva injection.  She denies jaw claudication.  Left sided neck pain radiates into shoulder but denies pain radiating down left arm and denies left arm numbness and weakness.  She reports some mild nausea when it is severe but no vomiting, visual disturbance, photophobia, or unilateral numbness or weakness.  She reports ear pain and sensation of aural fullness in the left ear but no tinnitus or hearing loss.  When she first lays down in bed at night, she has sensation of movement for a couple of minutes.  She denies any nasal congestion. Neck and head movement aggravate it.  Heating pad helps relieve it.  She previously was taking ibuprofen daily but recently started taking tramadol every 6 hours.  CT of head from 06/21/17 was personally reviewed and was unremarkable except for mild opacification in the left mastoid air cells.  She was prescribed augmentin for possible otitis media, which has not resolved the symptoms.  Labs from 06/10/17 include: Sed Rate 13; CMP with Na 140, K 4.2, Cl 105, Co2 29, glucose 100, BUN 15, Cr 0.74, t bili 0.4, ALP 118, AST 20 and ALT  14; CBC with WBC 7.7, HGB 13.6, HCT 41.1, and PLT 275.  Current NSAIDS:  no Current analgesics:  tramadol 50mg  Current triptans:  no Current anti-emetic:  no Current muscle relaxants:  no Current anti-anxiolytic:  alprazolam 0.5mg  three times daily as needed Current sleep aide:  no Current Antihypertensive medications:  no Current Antidepressant medications:  escitalopram 10mg  Current Anticonvulsant medications:  no Current Vitamins/Herbal/Supplements:  no Current Antihistamines/Decongestants:  Flonase Other therapy:  no  Past NSAIDS:  ibuprofen Past analgesics:  no Past abortive triptans:  no Past muscle relaxants:  no Past anti-emetic:  no Past antihypertensive medications:  no Past antidepressant medications:  no Past anticonvulsant medications:  no Past vitamins/Herbal/Supplements:  no Past antihistamines/decongestants:  no Other past therapies:  no  Depression:  No; Anxiety:  No Sleep hygiene:  Okay She denies personal history of headache. Family history of headache:  son  PAST MEDICAL HISTORY: Past Medical History:  Diagnosis Date  . Anxiety   . Family history of breast cancer   . Family history of ovarian cancer     PAST SURGICAL HISTORY: Past Surgical History:  Procedure Laterality Date  . APPENDECTOMY  1995  . BACK SURGERY     x's 3 days--Fusion on 2 diferent disk  . CHOLECYSTECTOMY  1994  . SPINE SURGERY  2004,05,09    microdiscectomy, fusion  L3-4, L5-6-- Dr Shelle Iron  . VAGINAL HYSTERECTOMY  1994   TAH-- endometriosis    MEDICATIONS: Current Outpatient Medications  on File Prior to Visit  Medication Sig Dispense Refill  . ALPRAZolam (XANAX) 0.5 MG tablet Take 1 tablet (0.5 mg total) 3 (three) times daily as needed by mouth. 60 tablet 2  . amoxicillin-clavulanate (AUGMENTIN) 875-125 MG tablet Take 1 tablet by mouth 2 (two) times daily. 20 tablet 0  . escitalopram (LEXAPRO) 10 MG tablet Take 10 mg by mouth daily.    . fluticasone (FLONASE) 50 MCG/ACT  nasal spray Place 2 sprays into both nostrils daily. 16 g 6  . traMADol (ULTRAM) 50 MG tablet Take 1 tablet (50 mg total) by mouth every 6 (six) hours as needed. 30 tablet 0   No current facility-administered medications on file prior to visit.     ALLERGIES: No Known Allergies  FAMILY HISTORY: Family History  Problem Relation Age of Onset  . Heart disease Mother 18       Lived until 47  . Hypertension Mother 22  . Dementia Mother   . Crohn's disease Father   . Congenital heart disease Father   . COPD Father   . Heart disease Maternal Aunt   . Ovarian cancer Maternal Grandmother 79  . Heart disease Maternal Grandfather   . Breast cancer Other        MGMs sister dx under 50  . Breast cancer Other        MFMs sister dx under 50  . Lung cancer Other        2 MGMs brothers with lung cancer  . Skin cancer Other        MGMs brother with skin cancer    SOCIAL HISTORY: Social History   Socioeconomic History  . Marital status: Married    Spouse name: Not on file  . Number of children: 2  . Years of education: Not on file  . Highest education level: Not on file  Social Needs  . Financial resource strain: Not on file  . Food insecurity - worry: Not on file  . Food insecurity - inability: Not on file  . Transportation needs - medical: Not on file  . Transportation needs - non-medical: Not on file  Occupational History  . Not on file  Tobacco Use  . Smoking status: Never Smoker  . Smokeless tobacco: Never Used  Substance and Sexual Activity  . Alcohol use: No  . Drug use: No  . Sexual activity: Yes    Partners: Male  Other Topics Concern  . Not on file  Social History Narrative   Exercise--walk---not enough per pt   Lives with husband in a one story home.  Has 2 children.  Works for [employer].      REVIEW OF SYSTEMS: Constitutional: No fevers, chills, or sweats, no generalized fatigue, change in appetite Eyes: No visual changes, double vision, eye pain Ear, nose and  throat: No hearing loss, ear pain, nasal congestion, sore throat Cardiovascular: No chest pain, palpitations Respiratory:  No shortness of breath at rest or with exertion, wheezes GastrointestinaI: No nausea, vomiting, diarrhea, abdominal pain, fecal incontinence Genitourinary:  No dysuria, urinary retention or frequency Musculoskeletal:  Neck pain Integumentary: No rash, pruritus, skin lesions Neurological: as above Psychiatric: No depression, insomnia, anxiety Endocrine: No palpitations, fatigue, diaphoresis, mood swings, change in appetite, change in weight, increased thirst Hematologic/Lymphatic:  No purpura, petechiae. Allergic/Immunologic: no itchy/runny eyes, nasal congestion, recent allergic reactions, rashes  PHYSICAL EXAM: Vitals:   07/01/17 0747  BP: 120/90  Pulse: 71  SpO2: 97%   General: No acute distress.  Patient appears well-groomed.  Head:  Normocephalic/atraumatic.  Tenderness to palpation in left suboccipital/hemicrania, temple, teeth and along jaw line but not TMJ. Eyes:  fundi examined but not visualized Neck: supple, left sided tenderness, full range of motion Back: No paraspinal tenderness Heart: regular rate and rhythm Lungs: Clear to auscultation bilaterally. Vascular: No carotid bruits. Neurological Exam: Mental status: alert and oriented to person, place, and time, recent and remote memory intact, fund of knowledge intact, attention and concentration intact, speech fluent and not dysarthric, language intact. Cranial nerves: CN I: not tested CN II: pupils equal, round and reactive to light, visual fields intact CN III, IV, VI:  full range of motion, no nystagmus, no ptosis CN V: facial sensation intact CN VII: upper and lower face symmetric CN VIII: hearing intact CN IX, X: gag intact, uvula midline CN XI: sternocleidomastoid and trapezius muscles intact CN XII: tongue midline Bulk & Tone: normal, no fasciculations. Motor:  5/5 throughout    Sensation: temperature and vibration sensation intact. Deep Tendon Reflexes:  2+ throughout, toes downgoing.  Finger to nose testing:  Without dysmetria.  Heel to shin:  Without dysmetria.  Gait:  Normal station and stride.  Able to turn and tandem walk. Romberg negative.  IMPRESSION: Left sided head and facial pain radiating into the neck.  First, a dental etiology such as TMJ dysfunction or tooth infection should be ruled out.  Other consideration would be hemicrania continua or atypical trigeminal neuralgia.  PLAN: 1.  We will get MRI of brain with and without contrast with attention to the left trigeminal nerve. 2.  Further recommendations pending results.  If MRI unrevealing,, consider indomethacin trial. 3.  I also advised that she follow up with her dentist. 4.  Follow up in 3 months.    Thank you for allowing me to take part in the care of this patient.  Shon MilletAdam Dejion Grillo, DO  CC: Seabron SpatesYvonne Lowne Chase, DO

## 2017-07-13 ENCOUNTER — Ambulatory Visit
Admission: RE | Admit: 2017-07-13 | Discharge: 2017-07-13 | Disposition: A | Discharge status: Home / Self Care | Payer: 59 | Source: Ambulatory Visit | Attending: Neurology | Admitting: Neurology

## 2017-07-13 DIAGNOSIS — R519 Headache, unspecified: Secondary | ICD-10-CM

## 2017-07-13 DIAGNOSIS — G500 Trigeminal neuralgia: Secondary | ICD-10-CM

## 2017-07-13 DIAGNOSIS — R51 Headache: Secondary | ICD-10-CM

## 2017-07-13 MED ORDER — GADOBENATE DIMEGLUMINE 529 MG/ML IV SOLN
20.0000 mL | Freq: Once | INTRAVENOUS | Status: AC | PRN
  Administered 2017-07-13: 20 mL via INTRAVENOUS

## 2017-07-14 ENCOUNTER — Other Ambulatory Visit: Payer: Self-pay | Admitting: *Deleted

## 2017-07-14 ENCOUNTER — Telehealth: Payer: Self-pay | Admitting: *Deleted

## 2017-07-14 MED ORDER — INDOMETHACIN 25 MG PO CAPS: 25.0000 mg | ORAL_CAPSULE | Freq: Three times a day (TID) | ORAL | 3 refills | Status: DC

## 2017-07-14 NOTE — Telephone Encounter (Signed)
-----   Message from Drema DallasAdam R Jaffe, DO sent at 07/14/2017  7:01 AM EDT ----- Also, I still want her to see her dentist to be checked out for TMJ dysfunction.

## 2017-07-14 NOTE — Telephone Encounter (Signed)
Patient given results and instructions.   

## 2017-07-14 NOTE — Telephone Encounter (Signed)
FAXED RX INDOCIN 25 MG TO [facility]

## 2017-07-14 NOTE — Telephone Encounter (Signed)
Patient given instructions

## 2017-07-14 NOTE — Telephone Encounter (Signed)
-----   Message from Drema DallasAdam R Jaffe, DO sent at 07/14/2017  7:01 AM EDT ----- MRI of brain is normal.  We can try a trial of indomethacin (an NSAID) to see if it resolves the headache.  Start 25mg  three times daily.  If no relief in 3 days, then increase to 50mg  three times daily.  If still no relief in 3 days, then increase to 75mg  three times daily and then she should contact us in another 3 days if no complete or only partial relief.  Since this is an NSAID, it can upset her stomach.  So she should take omeprazole 20mg  daily as well.

## 2017-08-07 ENCOUNTER — Telehealth: Payer: Self-pay | Admitting: Neurology

## 2017-08-07 MED ORDER — TOPIRAMATE 50 MG PO TABS: 50.0000 mg | ORAL_TABLET | Freq: Every day | ORAL | 1 refills | Status: DC

## 2017-08-07 NOTE — Telephone Encounter (Signed)
Called and spoke with Pt.  She is taking 75mg  TID of indomethacin, still having headache in vertex, radiating up from neck. Additional recommendations?

## 2017-08-07 NOTE — Telephone Encounter (Signed)
Start topiramate 50mg  at bedtime.  In 4 weeks, if headaches not improved, she should contact us and we can increase dose if needed.  Side effects may include numbness and tingling, but it typically improves once her body gets used to the dose.  Rarely, it may cause kidney stones so she should drink plenty of water.  I always like to let people know that rarely it may cause a type of glaucoma, so if she has vision change, she should see the eye doctor.

## 2017-08-07 NOTE — Telephone Encounter (Signed)
Called and advsd Pt to d/c indomethacin and start topiramate 50mg  QHS. She understands side effects and to call in 4 weeks if no better and we can increase dose. Sent in Rx to [facility].

## 2017-08-07 NOTE — Telephone Encounter (Signed)
Pt left a VM message saying she is still having headaches and wants to know if she should come back in or go somewhere else, please advise

## 2017-08-29 ENCOUNTER — Other Ambulatory Visit: Payer: Self-pay | Admitting: Neurology

## 2017-09-14 ENCOUNTER — Other Ambulatory Visit: Payer: Self-pay | Admitting: Family Medicine

## 2017-09-14 DIAGNOSIS — M158 Other polyosteoarthritis: Secondary | ICD-10-CM

## 2017-09-15 NOTE — Telephone Encounter (Signed)
Requesting: ULTRAM 50 MG Contract: 06/10/17 UDS: 06/10/17 Last OV: 2/181/19 Next OV:- Last Refill:  06/23/17   #30   Please advise

## 2017-11-13 ENCOUNTER — Other Ambulatory Visit: Payer: Self-pay | Admitting: Family Medicine

## 2017-11-13 DIAGNOSIS — M158 Other polyosteoarthritis: Secondary | ICD-10-CM

## 2017-11-17 NOTE — Telephone Encounter (Signed)
Requesting:tramadol  Contract:yes UDS:06/10/17 Last OV:06/23/17 Next OV:not scheduled  Last Refill:09/15/17 #[id number] Database:   Please advise

## 2017-11-25 ENCOUNTER — Other Ambulatory Visit: Payer: Self-pay | Admitting: Family Medicine

## 2017-11-25 ENCOUNTER — Telehealth: Payer: Self-pay

## 2017-11-25 DIAGNOSIS — M158 Other polyosteoarthritis: Secondary | ICD-10-CM

## 2017-11-25 MED ORDER — TRAMADOL HCL 50 MG PO TABS: 50.0000 mg | ORAL_TABLET | Freq: Four times a day (QID) | ORAL | 1 refills | Status: DC | PRN

## 2017-11-25 NOTE — Telephone Encounter (Signed)
done

## 2017-11-25 NOTE — Telephone Encounter (Signed)
Requesting: xanax 0.5mg  tid prn Contract: yes UDS: 06/10/17 Last OV: 06/23/17 Next Ov: N/A Last refill: 03/24/2017, #60, 2 RF Database: no discrepancies found  Please advise.

## 2017-11-26 ENCOUNTER — Other Ambulatory Visit: Payer: Self-pay | Admitting: Family Medicine

## 2017-11-26 ENCOUNTER — Telehealth: Payer: Self-pay | Admitting: *Deleted

## 2017-11-26 DIAGNOSIS — F411 Generalized anxiety disorder: Secondary | ICD-10-CM

## 2017-11-26 MED ORDER — ALPRAZOLAM 0.5 MG PO TABS: 0.5000 mg | ORAL_TABLET | Freq: Three times a day (TID) | ORAL | 2 refills | Status: DC | PRN

## 2017-11-26 NOTE — Telephone Encounter (Signed)
[facility], Kelli Gould requesting refill for alprazolam  Database ran and is on your desk for review.  Last filled per database: 08/06/16 Last written: 03/24/17 (looks like it was never filled) Last ov: 06/23/17 Next ov: none Contract: 06/10/17 UDS: 12/08/17

## 2017-11-26 NOTE — Telephone Encounter (Signed)
done

## 2017-12-04 ENCOUNTER — Encounter: Payer: Self-pay | Admitting: Family Medicine

## 2017-12-04 ENCOUNTER — Ambulatory Visit (INDEPENDENT_AMBULATORY_CARE_PROVIDER_SITE_OTHER): Payer: 59 | Admitting: Family Medicine

## 2017-12-04 VITALS — BP 148/84 | HR 74 | Temp 98.1°F | Ht 64.5 in | Wt 226.8 lb

## 2017-12-04 DIAGNOSIS — F321 Major depressive disorder, single episode, moderate: Secondary | ICD-10-CM

## 2017-12-04 MED ORDER — VORTIOXETINE HBR 10 MG PO TABS: 10.0000 mg | ORAL_TABLET | Freq: Every day | ORAL | 0 refills | Status: DC

## 2017-12-04 MED ORDER — VORTIOXETINE HBR 20 MG PO TABS: 20.0000 mg | ORAL_TABLET | Freq: Every day | ORAL | 0 refills | Status: DC

## 2017-12-04 MED ORDER — VORTIOXETINE HBR 10 MG PO TABS: 10.0000 mg | ORAL_TABLET | Freq: Every day | ORAL | 5 refills | Status: DC

## 2017-12-04 NOTE — Progress Notes (Signed)
Patient ID: Kelli Gould, adult    DOB: Oct 04, 1955  Age: 62 y.o. MRN: [id number]    Subjective:  Subjective  HPI Kelli BosJudy M Gould presents for depression and inc stress.  Her husband had back surgery and has been very difficult She is not suicidal or homicidal-- she thinks she just needs to change her meds.     Review of Systems  Constitutional: Positive for fatigue. Negative for chills, fever and unexpected weight change.  HENT: Negative for congestion and hearing loss.   Eyes: Negative for discharge.  Respiratory: Negative for cough and shortness of breath.   Cardiovascular: Negative for chest pain, palpitations and leg swelling.  Gastrointestinal: Negative for abdominal pain, blood in stool, constipation, diarrhea, nausea and vomiting.  Genitourinary: Negative for dysuria, frequency, hematuria and urgency.  Musculoskeletal: Negative for back pain and myalgias.  Skin: Negative for rash.  Allergic/Immunologic: Negative for environmental allergies.  Neurological: Negative for dizziness, weakness and headaches.  Hematological: Does not bruise/bleed easily.  Psychiatric/Behavioral: Negative for suicidal ideas. The patient is not nervous/anxious.     History Past Medical History:  Diagnosis Date  . Anxiety   . Family history of breast cancer   . Family history of ovarian cancer     She has a past surgical history that includes Cholecystectomy (1994); Appendectomy (1995); Back surgery; Spine surgery (4098,11,91(2004,05,09); and Vaginal hysterectomy (1994).   Her family history includes Breast cancer in her other and other; COPD in her father; Congenital heart disease in her father; Crohn's disease in her father; Dementia in her mother; Heart disease in her maternal aunt and maternal grandfather; Heart disease (age of onset: 165) in her mother; Hypertension (age of onset: 6365) in her mother; Lung cancer in her other; Ovarian cancer (age of onset: 356) in her maternal grandmother; Skin cancer in her  other.She reports that she has never smoked. She has never used smokeless tobacco. She reports that she does not drink alcohol or use drugs.  Current Outpatient Medications on File Prior to Visit  Medication Sig Dispense Refill  . ALPRAZolam (XANAX) 0.5 MG tablet Take 1 tablet (0.5 mg total) by mouth 3 (three) times daily as needed. 60 tablet 2  . traMADol (ULTRAM) 50 MG tablet TAKE 1 TABLET (50 MG TOTAL) BY MOUTH EVERY 6 (SIX) HOURS AS NEEDED. 30 tablet 0  . traMADol (ULTRAM) 50 MG tablet Take 1 tablet (50 mg total) by mouth every 6 (six) hours as needed. (Patient not taking: Reported on 12/04/2017) 30 tablet 1   No current facility-administered medications on file prior to visit.      Objective:  Objective  Physical Exam  Constitutional: She is oriented to person, place, and time. She appears well-developed and well-nourished.  HENT:  Head: Normocephalic and atraumatic.  Eyes: Conjunctivae and EOM are normal.  Neck: Normal range of motion. Neck supple. No JVD present. Carotid bruit is not present. No thyromegaly present.  Cardiovascular: Normal rate, regular rhythm and normal heart sounds.  No murmur heard. Pulmonary/Chest: Effort normal and breath sounds normal. No respiratory distress. She has no wheezes. She has no rales. She exhibits no tenderness.  Musculoskeletal: She exhibits no edema.  Neurological: She is alert and oriented to person, place, and time.  Psychiatric: Her speech is normal and behavior is normal. Judgment and thought content normal. Her mood appears not anxious. Her affect is not angry and not inappropriate. Cognition and memory are normal. She exhibits a depressed mood.  Nursing note and vitals reviewed.  BP Marland Kitchen(!)  148/84 (BP Location: Left Arm, Patient Position: Sitting, Cuff Size: Large)   Pulse 74   Temp 98.1 F (36.7 C) (Oral)   Ht 5' 4.5" (1.638 m)   Wt 226 lb 12.8 oz (102.9 kg)   SpO2 100%   BMI 38.33 kg/m  Wt Readings from Last 3 Encounters:  12/04/17  226 lb 12.8 oz (102.9 kg)  07/01/17 224 lb 8 oz (101.8 kg)  06/23/17 224 lb (101.6 kg)     Lab Results  Component Value Date   WBC 7.7 06/10/2017   HGB 13.6 06/10/2017   HCT 41.1 06/10/2017   PLT 275.0 06/10/2017   GLUCOSE 100 (H) 06/10/2017   CHOL 194 06/27/2015   TRIG 146.0 06/27/2015   HDL 38.50 (L) 06/27/2015   LDLDIRECT 138.8 10/16/2012   LDLCALC 126 (H) 06/27/2015   ALT 14 06/10/2017   AST 20 06/10/2017   NA 140 06/10/2017   K 4.2 06/10/2017   CL 105 06/10/2017   CREATININE 0.74 06/10/2017   BUN 15 06/10/2017   CO2 29 06/10/2017   TSH 2.69 06/27/2015   INR 0.9 05/08/2007   HGBA1C 5.7 04/18/2016    Mr Brain W Wo Contrast  Result Date: 07/13/2017 CLINICAL DATA:  Left-sided headaches, left facial vein, and left neck pain for 8 weeks. Nausea. EXAM: MRI HEAD WITHOUT AND WITH CONTRAST TECHNIQUE: Multiplanar, multiecho pulse sequences of the brain and surrounding structures were obtained without and with intravenous contrast. CONTRAST:  20mL MULTIHANCE GADOBENATE DIMEGLUMINE 529 MG/ML IV SOLN COMPARISON:  Head CT 06/20/2017 FINDINGS: Brain: There is no evidence of acute infarct, intracranial hemorrhage, mass, midline shift, or extra-axial fluid collection. The ventricles and sulci are normal. The brain is normal in signal. No abnormal enhancement is identified. Vascular: Major intracranial vascular flow voids are preserved. Skull and upper cervical spine: Unremarkable bone marrow signal. Sinuses/Orbits: Unremarkable orbits. Clear paranasal sinuses. Unchanged trace left mastoid effusion. Other: None. IMPRESSION: Negative brain MRI. Electronically Signed   By: Sebastian Ache M.D.   On: 07/13/2017 14:12     Assessment & Plan:  Plan  I have discontinued [place] Gustafson. Barreras's escitalopram, amoxicillin-clavulanate, fluticasone, indomethacin, topiramate, vortioxetine HBr, and vortioxetine HBr. I am also having her start on vortioxetine HBr. Additionally, I am having her maintain her  traMADol, traMADol, and ALPRAZolam.  Meds ordered this encounter  Medications  . DISCONTD: vortioxetine HBr (TRINTELLIX) 10 MG TABS tablet    Sig: Take 1 tablet (10 mg total) by mouth daily.    Dispense:  7 tablet    Refill:  0  . DISCONTD: vortioxetine HBr (TRINTELLIX) 20 MG TABS tablet    Sig: Take 1 tablet (20 mg total) by mouth daily.    Dispense:  21 tablet    Refill:  0  . vortioxetine HBr (TRINTELLIX) 10 MG TABS tablet    Sig: Take 1 tablet (10 mg total) by mouth daily.    Dispense:  30 tablet    Refill:  5    Problem List Items Addressed This Visit    None    Visit Diagnoses    Depression, major, single episode, moderate (HCC)    -  Primary   Relevant Medications   vortioxetine HBr (TRINTELLIX) 10 MG TABS tablet       Offered counselor but pt refused rto 1 month or sooner prn   Follow-up: Return in about 1 month (around 01/01/2018).  Donato Schultz, DO

## 2017-12-04 NOTE — Patient Instructions (Signed)

## 2018-01-06 ENCOUNTER — Encounter: Payer: Self-pay | Admitting: Family Medicine

## 2018-01-06 ENCOUNTER — Ambulatory Visit (INDEPENDENT_AMBULATORY_CARE_PROVIDER_SITE_OTHER): Payer: 59 | Admitting: Family Medicine

## 2018-01-06 VITALS — BP 135/63 | HR 73 | Temp 98.2°F | Resp 16 | Ht 65.0 in | Wt 227.6 lb

## 2018-01-06 DIAGNOSIS — Z23 Encounter for immunization: Secondary | ICD-10-CM | POA: Diagnosis not present

## 2018-01-06 DIAGNOSIS — M159 Polyosteoarthritis, unspecified: Secondary | ICD-10-CM | POA: Insufficient documentation

## 2018-01-06 DIAGNOSIS — M158 Other polyosteoarthritis: Secondary | ICD-10-CM

## 2018-01-06 DIAGNOSIS — F411 Generalized anxiety disorder: Secondary | ICD-10-CM | POA: Insufficient documentation

## 2018-01-06 DIAGNOSIS — Z79899 Other long term (current) drug therapy: Secondary | ICD-10-CM | POA: Diagnosis not present

## 2018-01-06 MED ORDER — FLUOXETINE HCL 10 MG PO CAPS: 10.0000 mg | ORAL_CAPSULE | Freq: Every day | ORAL | 3 refills | Status: DC

## 2018-01-06 NOTE — Patient Instructions (Signed)

## 2018-01-06 NOTE — Assessment & Plan Note (Signed)
con't prn xanax Add prozac rto 1 month or sooner prn

## 2018-01-06 NOTE — Progress Notes (Signed)
Patient ID: QUINTAVIA PALEO, adult    DOB: 13-Jul-1955  Age: 62 y.o. MRN: [id number]    Subjective:  Subjective  HPI Kelli Gould presents for f/u anxiety/ depression.  The trinellex worked well for the depression but caused a bad headache.  She took her last one yesterday.    Review of Systems  Constitutional: Negative for appetite change, diaphoresis, fatigue and unexpected weight change.  Eyes: Negative for pain, redness and visual disturbance.  Respiratory: Negative for cough, chest tightness, shortness of breath and wheezing.   Cardiovascular: Negative for chest pain, palpitations and leg swelling.  Endocrine: Negative for cold intolerance, heat intolerance, polydipsia, polyphagia and polyuria.  Genitourinary: Negative for difficulty urinating, dysuria and frequency.  Neurological: Positive for headaches. Negative for dizziness, light-headedness and numbness.  Psychiatric/Behavioral: Negative for behavioral problems, confusion, decreased concentration, dysphoric mood, hallucinations, self-injury, sleep disturbance and suicidal ideas. The patient is not nervous/anxious and is not hyperactive.     History Past Medical History:  Diagnosis Date  . Anxiety   . Family history of breast cancer   . Family history of ovarian cancer     She has a past surgical history that includes Cholecystectomy (1994); Appendectomy (1995); Back surgery; Spine surgery (3354,56,25); and Vaginal hysterectomy (1994).   Her family history includes Breast cancer in her other and other; COPD in her father; Congenital heart disease in her father; Crohn's disease in her father; Dementia in her mother; Heart disease in her maternal aunt and maternal grandfather; Heart disease (age of onset: 56) in her mother; Hypertension (age of onset: 98) in her mother; Lung cancer in her other; Ovarian cancer (age of onset: 27) in her maternal grandmother; Skin cancer in her other.She reports that she has never smoked. She has  never used smokeless tobacco. She reports that she does not drink alcohol or use drugs.  Current Outpatient Medications on File Prior to Visit  Medication Sig Dispense Refill  . ALPRAZolam (XANAX) 0.5 MG tablet Take 1 tablet (0.5 mg total) by mouth 3 (three) times daily as needed. 60 tablet 2  . traMADol (ULTRAM) 50 MG tablet TAKE 1 TABLET (50 MG TOTAL) BY MOUTH EVERY 6 (SIX) HOURS AS NEEDED. 30 tablet 0   No current facility-administered medications on file prior to visit.      Objective:  Objective  Physical Exam  Constitutional: She is oriented to person, place, and time. She appears well-developed and well-nourished.  HENT:  Head: Normocephalic and atraumatic.  Eyes: Conjunctivae and EOM are normal.  Neck: Normal range of motion. Neck supple. No JVD present. Carotid bruit is not present. No thyromegaly present.  Cardiovascular: Normal rate, regular rhythm and normal heart sounds.  No murmur heard. Pulmonary/Chest: Effort normal and breath sounds normal. No respiratory distress. She has no wheezes. She has no rales. She exhibits no tenderness.  Musculoskeletal: She exhibits no edema.  Neurological: She is alert and oriented to person, place, and time.  Psychiatric: She has a normal mood and affect. Her behavior is normal. Judgment and thought content normal.   BP 135/63 (BP Location: Left Arm, Cuff Size: Large)   Pulse 73   Temp 98.2 F (36.8 C) (Oral)   Resp 16   Ht 5\' 5"  (1.651 m)   Wt 227 lb 9.6 oz (103.2 kg)   SpO2 99%   BMI 37.87 kg/m  Wt Readings from Last 3 Encounters:  01/06/18 227 lb 9.6 oz (103.2 kg)  12/04/17 226 lb 12.8 oz (102.9 kg)  07/01/17  224 lb 8 oz (101.8 kg)     Lab Results  Component Value Date   WBC 7.7 06/10/2017   HGB 13.6 06/10/2017   HCT 41.1 06/10/2017   PLT 275.0 06/10/2017   GLUCOSE 100 (H) 06/10/2017   CHOL 194 06/27/2015   TRIG 146.0 06/27/2015   HDL 38.50 (L) 06/27/2015   LDLDIRECT 138.8 10/16/2012   LDLCALC 126 (H) 06/27/2015    ALT 14 06/10/2017   AST 20 06/10/2017   NA 140 06/10/2017   K 4.2 06/10/2017   CL 105 06/10/2017   CREATININE 0.74 06/10/2017   BUN 15 06/10/2017   CO2 29 06/10/2017   TSH 2.69 06/27/2015   INR 0.9 05/08/2007   HGBA1C 5.7 04/18/2016    Mr Brain W Wo Contrast  Result Date: 07/13/2017 CLINICAL DATA:  Left-sided headaches, left facial vein, and left neck pain for 8 weeks. Nausea. EXAM: MRI HEAD WITHOUT AND WITH CONTRAST TECHNIQUE: Multiplanar, multiecho pulse sequences of the brain and surrounding structures were obtained without and with intravenous contrast. CONTRAST:  20mL MULTIHANCE GADOBENATE DIMEGLUMINE 529 MG/ML IV SOLN COMPARISON:  Head CT 06/20/2017 FINDINGS: Brain: There is no evidence of acute infarct, intracranial hemorrhage, mass, midline shift, or extra-axial fluid collection. The ventricles and sulci are normal. The brain is normal in signal. No abnormal enhancement is identified. Vascular: Major intracranial vascular flow voids are preserved. Skull and upper cervical spine: Unremarkable bone marrow signal. Sinuses/Orbits: Unremarkable orbits. Clear paranasal sinuses. Unchanged trace left mastoid effusion. Other: None. IMPRESSION: Negative brain MRI. Electronically Signed   By: Sebastian Ache M.D.   On: 07/13/2017 14:12     Assessment & Plan:  Plan  I have discontinued Kelli Joswick. Gould's vortioxetine HBr. I am also having her start on FLUoxetine. Additionally, I am having her maintain her traMADol and ALPRAZolam.  Meds ordered this encounter  Medications  . FLUoxetine (PROZAC) 10 MG capsule    Sig: Take 1 capsule (10 mg total) by mouth daily.    Dispense:  30 capsule    Refill:  3    Problem List Items Addressed This Visit      Unprioritized   Degenerative joint disease involving multiple joints   Relevant Orders   Pain Mgmt, Tramadol w/medMATCH, U   Generalized anxiety disorder - Primary    con't prn xanax Add prozac rto 1 month or sooner prn       Relevant  Medications   FLUoxetine (PROZAC) 10 MG capsule   Other Relevant Orders   Pain Mgmt, Profile 8 w/Conf, U   Pain Mgmt, Tramadol w/medMATCH, U    Other Visit Diagnoses    High risk medication use       Relevant Orders   Pain Mgmt, Profile 8 w/Conf, U   Pain Mgmt, Tramadol w/medMATCH, U   Need for shingles vaccine       Relevant Orders   Varicella-zoster vaccine IM (Shingrix) (Completed)      Follow-up: Return in about 4 weeks (around 02/03/2018), or if symptoms worsen or fail to improve.  Donato Schultz, DO

## 2018-01-09 LAB — PAIN MGMT, PROFILE 8 W/CONF, U
6 Acetylmorphine: NEGATIVE ng/mL (ref <10)
ALCOHOL METABOLITES: NEGATIVE ng/mL (ref <500)
Amphetamines: NEGATIVE ng/mL (ref <500)
BENZODIAZEPINES: NEGATIVE ng/mL (ref <100)
Buprenorphine, Urine: NEGATIVE ng/mL (ref <5)
COCAINE METABOLITE: NEGATIVE ng/mL (ref <150)
Creatinine: 115 mg/dL
MDMA: NEGATIVE ng/mL (ref <500)
Marijuana Metabolite: NEGATIVE ng/mL (ref <20)
Opiates: NEGATIVE ng/mL (ref <100)
Oxidant: NEGATIVE ug/mL (ref <200)
Oxycodone: NEGATIVE ng/mL (ref <100)
PH: 6.51 (ref 4.5–9.0)

## 2018-01-09 LAB — PAIN MGMT, TRAMADOL W/MEDMATCH, U
Desmethyltramadol: NEGATIVE ng/mL (ref <100)
TRAMADOL: NEGATIVE ng/mL (ref <100)

## 2018-01-28 ENCOUNTER — Other Ambulatory Visit: Payer: Self-pay | Admitting: Family Medicine

## 2018-01-28 DIAGNOSIS — F411 Generalized anxiety disorder: Secondary | ICD-10-CM

## 2018-02-03 ENCOUNTER — Ambulatory Visit (INDEPENDENT_AMBULATORY_CARE_PROVIDER_SITE_OTHER): Payer: 59 | Admitting: Family Medicine

## 2018-02-03 ENCOUNTER — Encounter: Payer: Self-pay | Admitting: Family Medicine

## 2018-02-03 VITALS — BP 122/86 | HR 82 | Temp 98.2°F | Resp 26 | Ht 65.0 in | Wt 228.4 lb

## 2018-02-03 DIAGNOSIS — M158 Other polyosteoarthritis: Secondary | ICD-10-CM | POA: Diagnosis not present

## 2018-02-03 DIAGNOSIS — F411 Generalized anxiety disorder: Secondary | ICD-10-CM

## 2018-02-03 MED ORDER — TRAMADOL HCL 50 MG PO TABS: 50.0000 mg | ORAL_TABLET | Freq: Four times a day (QID) | ORAL | 0 refills | Status: DC | PRN

## 2018-02-03 NOTE — Progress Notes (Signed)
Patient ID: Kelli Gould, adult    DOB: 1956/05/06  Age: 62 y.o. MRN: [id number]    Subjective:  Subjective  HPI Kelli Gould presents for f/u anxiety.  She is doing well with the prozac.  No complaints.  She does need a refill on the tramadol.    Review of Systems  Constitutional: Negative for chills and fever.  HENT: Negative for congestion and hearing loss.   Eyes: Negative for discharge.  Respiratory: Negative for cough and shortness of breath.   Cardiovascular: Negative for chest pain, palpitations and leg swelling.  Gastrointestinal: Negative for abdominal pain, blood in stool, constipation, diarrhea, nausea and vomiting.  Genitourinary: Negative for dysuria, frequency, hematuria and urgency.  Musculoskeletal: Negative for back pain and myalgias.  Skin: Negative for rash.  Allergic/Immunologic: Negative for environmental allergies.  Neurological: Negative for dizziness, weakness and headaches.  Hematological: Does not bruise/bleed easily.  Psychiatric/Behavioral: Negative for suicidal ideas. The patient is not nervous/anxious.     History Past Medical History:  Diagnosis Date  . Anxiety   . Family history of breast cancer   . Family history of ovarian cancer     She has a past surgical history that includes Cholecystectomy (1994); Appendectomy (1995); Back surgery; Spine surgery (4098,11,91); and Vaginal hysterectomy (1994).   Her family history includes Breast cancer in her other and other; COPD in her father; Congenital heart disease in her father; Crohn's disease in her father; Dementia in her mother; Heart disease in her maternal aunt and maternal grandfather; Heart disease (age of onset: 103) in her mother; Hypertension (age of onset: 52) in her mother; Lung cancer in her other; Ovarian cancer (age of onset: 59) in her maternal grandmother; Skin cancer in her other.She reports that she has never smoked. She has never used smokeless tobacco. She reports that she does  not drink alcohol or use drugs.  Current Outpatient Medications on File Prior to Visit  Medication Sig Dispense Refill  . FLUoxetine (PROZAC) 10 MG capsule Take 1 capsule (10 mg total) by mouth daily. 90 capsule 3   No current facility-administered medications on file prior to visit.      Objective:  Objective  Physical Exam  Constitutional: She is oriented to person, place, and time. She appears well-developed and well-nourished.  HENT:  Head: Normocephalic and atraumatic.  Eyes: Conjunctivae and EOM are normal.  Neck: Normal range of motion. Neck supple. No JVD present. Carotid bruit is not present. No thyromegaly present.  Cardiovascular: Normal rate, regular rhythm and normal heart sounds.  No murmur heard. Pulmonary/Chest: Effort normal and breath sounds normal. No respiratory distress. She has no wheezes. She has no rales. She exhibits no tenderness.  Musculoskeletal: She exhibits no edema.  Neurological: She is alert and oriented to person, place, and time.  Psychiatric: She has a normal mood and affect.  Nursing note and vitals reviewed.  BP 122/86 (BP Location: Left Arm, Cuff Size: Large)   Pulse 82   Temp 98.2 F (36.8 C) (Oral)   Resp (!) 26   Ht 5\' 5"  (1.651 m)   Wt 228 lb 6.4 oz (103.6 kg)   SpO2 98%   BMI 38.01 kg/m  Wt Readings from Last 3 Encounters:  02/03/18 228 lb 6.4 oz (103.6 kg)  01/06/18 227 lb 9.6 oz (103.2 kg)  12/04/17 226 lb 12.8 oz (102.9 kg)     Lab Results  Component Value Date   WBC 7.7 06/10/2017   HGB 13.6 06/10/2017  HCT 41.1 06/10/2017   PLT 275.0 06/10/2017   GLUCOSE 100 (H) 06/10/2017   CHOL 194 06/27/2015   TRIG 146.0 06/27/2015   HDL 38.50 (L) 06/27/2015   LDLDIRECT 138.8 10/16/2012   LDLCALC 126 (H) 06/27/2015   ALT 14 06/10/2017   AST 20 06/10/2017   NA 140 06/10/2017   K 4.2 06/10/2017   CL 105 06/10/2017   CREATININE 0.74 06/10/2017   BUN 15 06/10/2017   CO2 29 06/10/2017   TSH 2.69 06/27/2015   INR 0.9  05/08/2007   HGBA1C 5.7 04/18/2016    Mr Brain W Wo Contrast  Result Date: 07/13/2017 CLINICAL DATA:  Left-sided headaches, left facial vein, and left neck pain for 8 weeks. Nausea. EXAM: MRI HEAD WITHOUT AND WITH CONTRAST TECHNIQUE: Multiplanar, multiecho pulse sequences of the brain and surrounding structures were obtained without and with intravenous contrast. CONTRAST:  20mL MULTIHANCE GADOBENATE DIMEGLUMINE 529 MG/ML IV SOLN COMPARISON:  Head CT 06/20/2017 FINDINGS: Brain: There is no evidence of acute infarct, intracranial hemorrhage, mass, midline shift, or extra-axial fluid collection. The ventricles and sulci are normal. The brain is normal in signal. No abnormal enhancement is identified. Vascular: Major intracranial vascular flow voids are preserved. Skull and upper cervical spine: Unremarkable bone marrow signal. Sinuses/Orbits: Unremarkable orbits. Clear paranasal sinuses. Unchanged trace left mastoid effusion. Other: None. IMPRESSION: Negative brain MRI. Electronically Signed   By: Sebastian Ache M.D.   On: 07/13/2017 14:12     Assessment & Plan:  Plan  I have discontinued Kelli Gould's ALPRAZolam. I am also having her maintain her FLUoxetine and traMADol.  Meds ordered this encounter  Medications  . traMADol (ULTRAM) 50 MG tablet    Sig: Take 1 tablet (50 mg total) by mouth every 6 (six) hours as needed.    Dispense:  30 tablet    Refill:  0    Not to exceed 5 additional fills before 03/14/2018    Problem List Items Addressed This Visit      Unprioritized   Degenerative joint disease involving multiple joints   Relevant Medications   traMADol (ULTRAM) 50 MG tablet   Generalized anxiety disorder - Primary    Improved con't prozac Pt has cpe next month         Follow-up: Return if symptoms worsen or fail to improve, for as scheduled.  Donato Schultz, DO

## 2018-02-03 NOTE — Assessment & Plan Note (Signed)
Improved con't prozac Pt has cpe next month

## 2018-02-03 NOTE — Patient Instructions (Signed)

## 2018-02-14 IMAGING — DX DG CHEST 2V
2 series · 2 of 2 positions shown · non-contrast
Comparison: 05/13/2007

CLINICAL DATA: Left chest pain for 10 days

EXAM:
CHEST  2 VIEW

[chest pa]
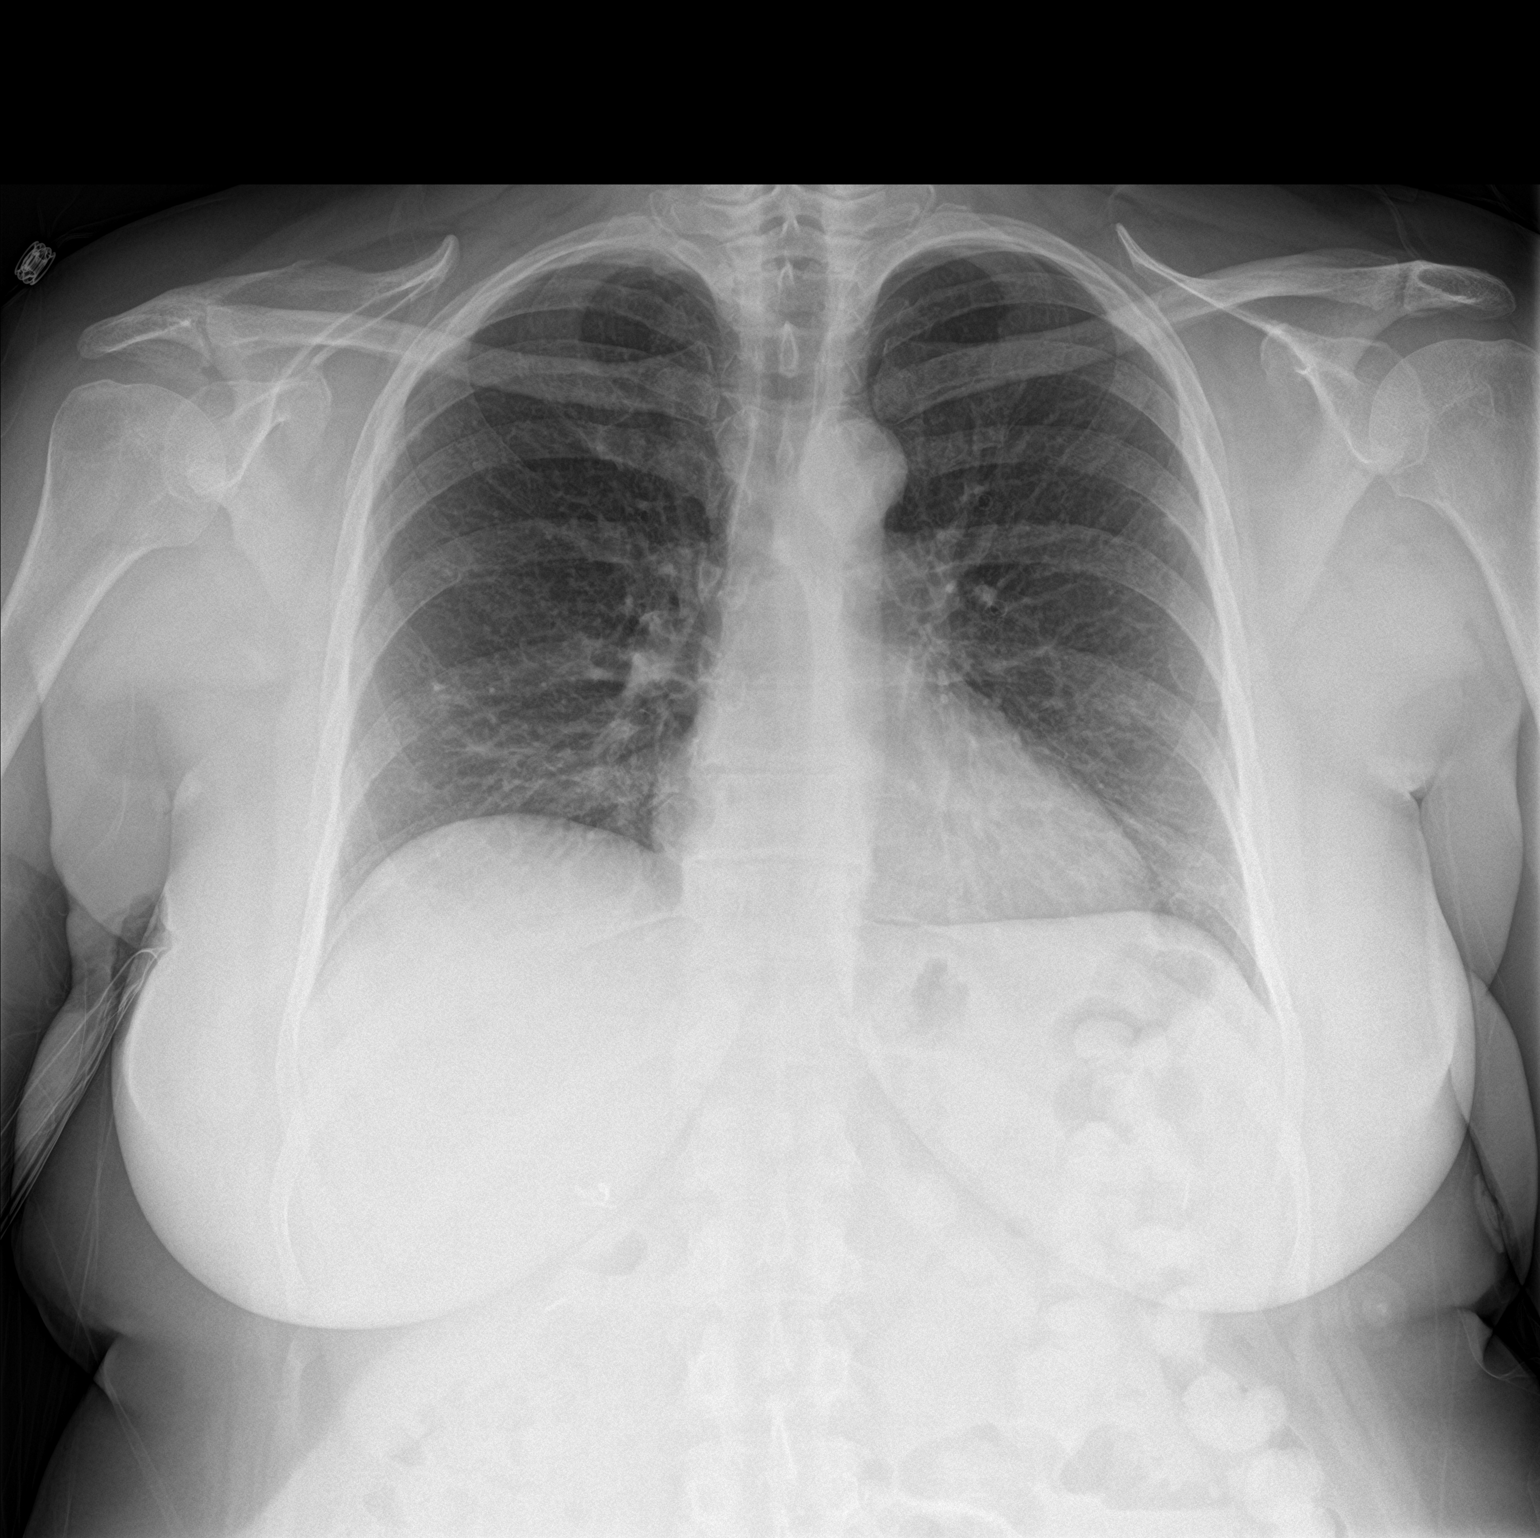

[chest lat]
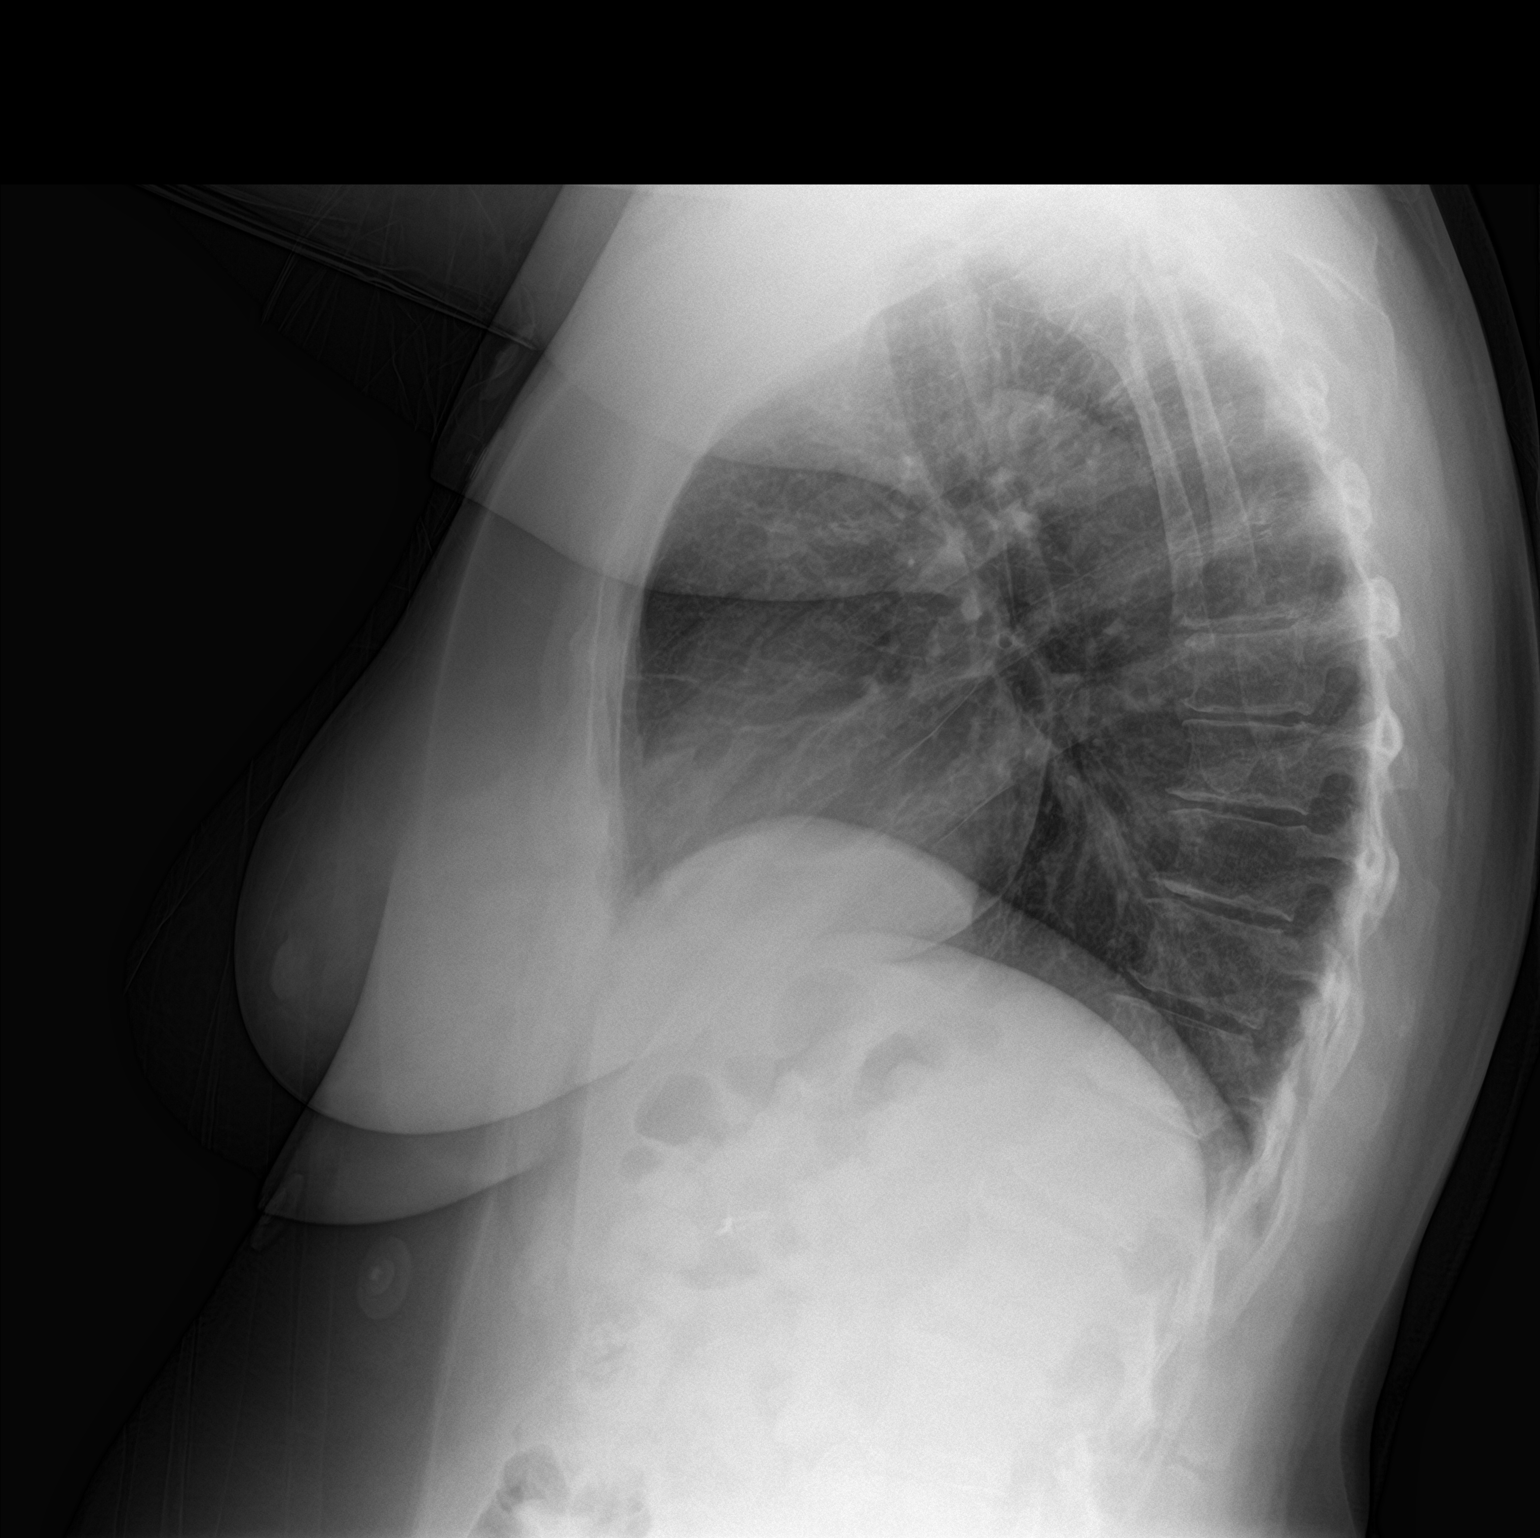

[2 of 2 positions shown; findings below may reference images not displayed]

FINDINGS: Normal heart size and mediastinal contours. No acute infiltrate or
edema. No effusion or pneumothorax. Cholecystectomy clips. No acute
osseous findings.
IMPRESSION: No active cardiopulmonary disease.

## 2018-02-14 IMAGING — CT CT RENAL STONE PROTOCOL
2 of 4 series · 12 of 46 positions shown, 14 images · non-contrast
Comparison: Two days ago

CLINICAL DATA: Left-sided pain.

EXAM:
CT ABDOMEN AND PELVIS WITHOUT CONTRAST
TECHNIQUE: Multidetector CT imaging of the abdomen and pelvis was performed
following the standard protocol without IV contrast.

[Series 201: stone study, idose (2) · axial · 0.68mm/px · z∈[+216,+606]mm · 9 of 94 slices shown, 11 images]
[im 8/94  soft-tissue]
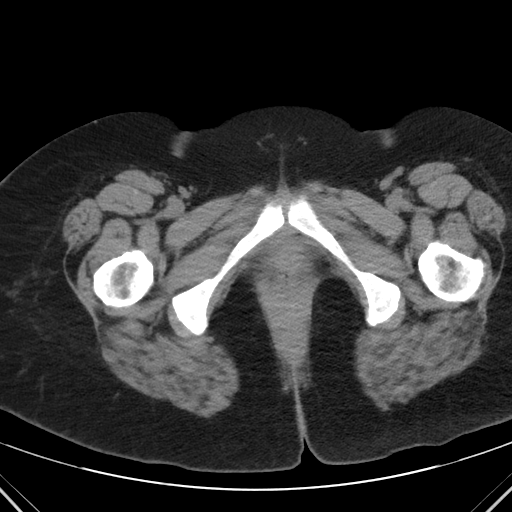
[im 8/94  bone]
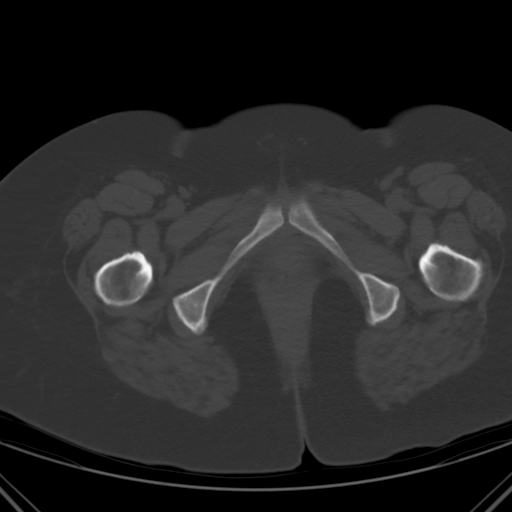
[im 18/94  soft-tissue]
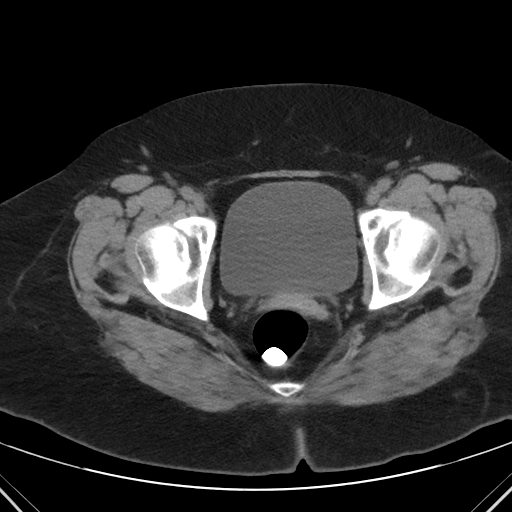
[im 26/94  soft-tissue]
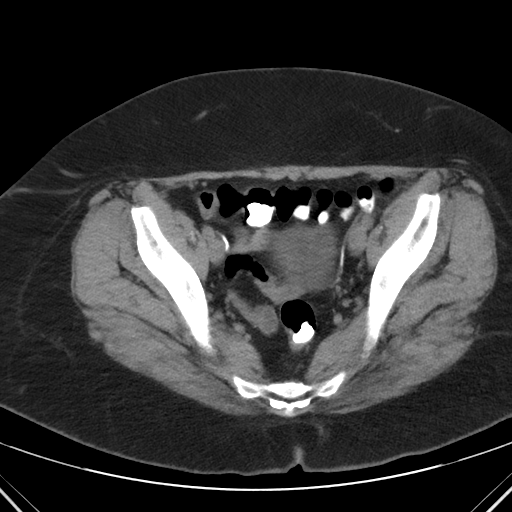
[im 36/94  soft-tissue]
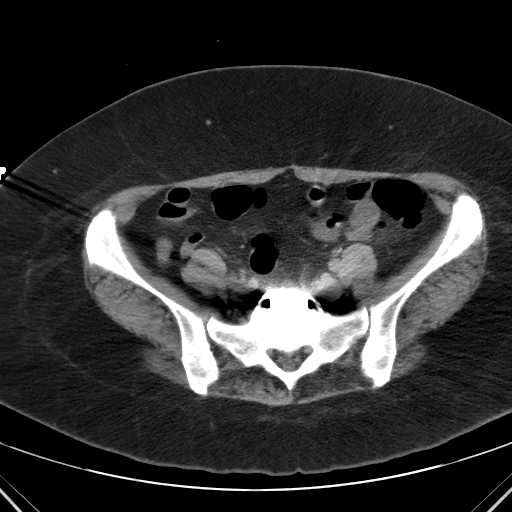
[im 47/94  soft-tissue]
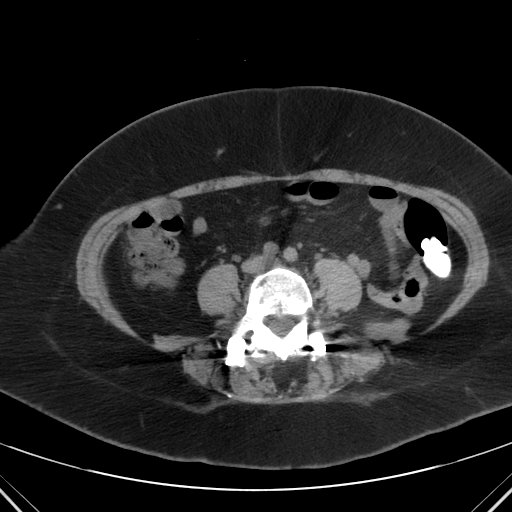
[im 58/94  soft-tissue]
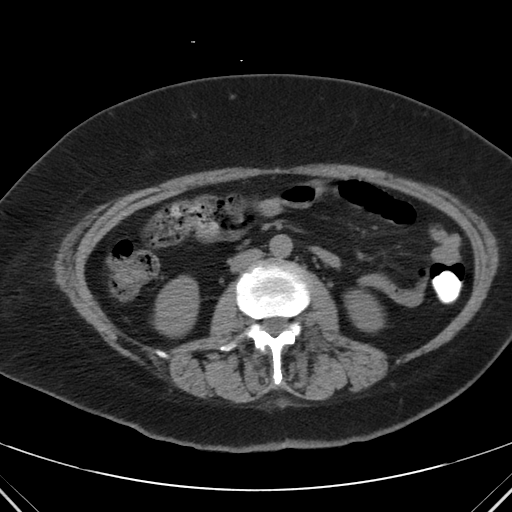
[im 68/94  soft-tissue]
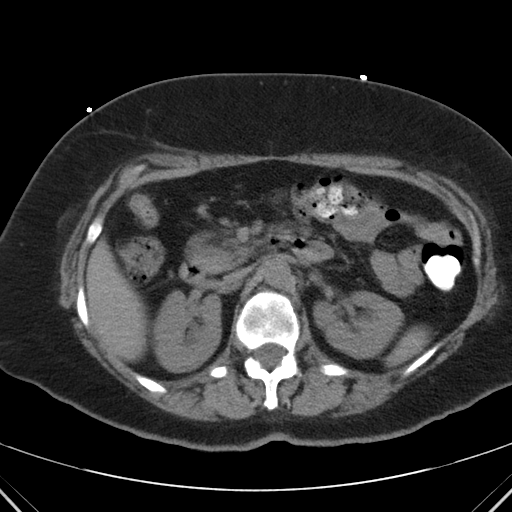
[im 76/94  soft-tissue]
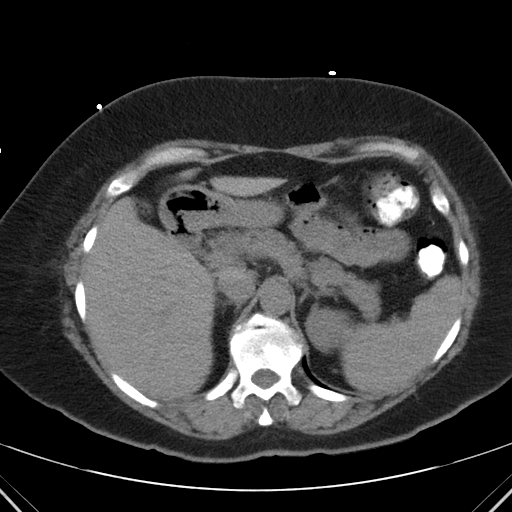
[im 86/94  soft-tissue]
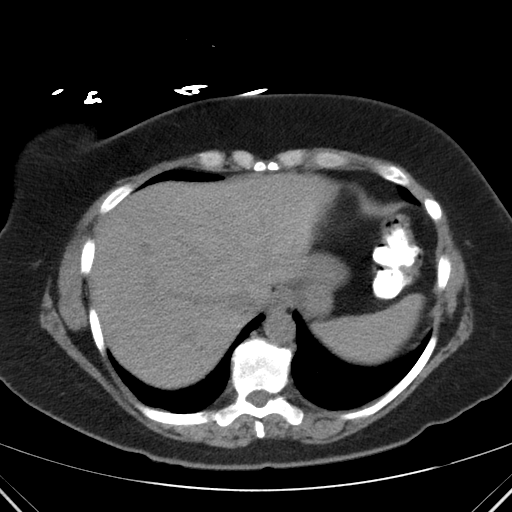
[im 86/94  bone]
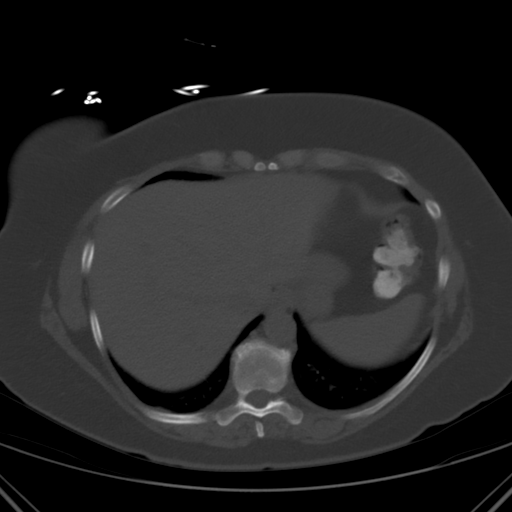

[Series 203: coronals, idose (2) · coronal · 0.45mm/px · 3 of 109 slices shown]
[im 37/109  soft-tissue]
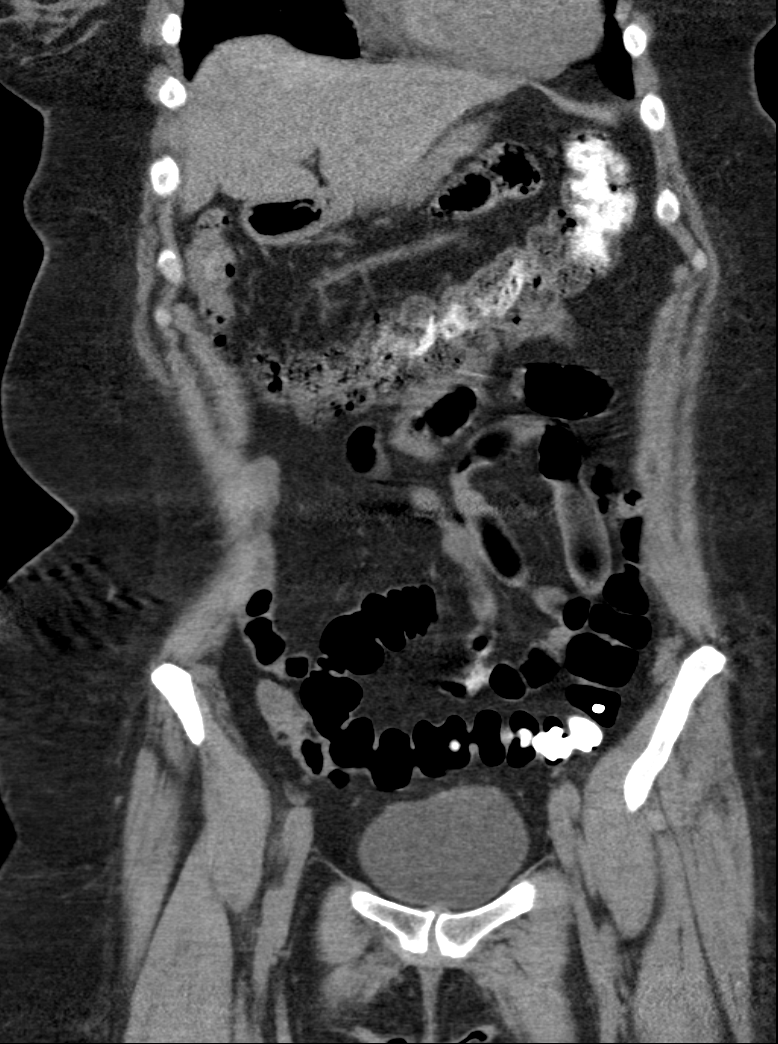
[im 49/109  soft-tissue]
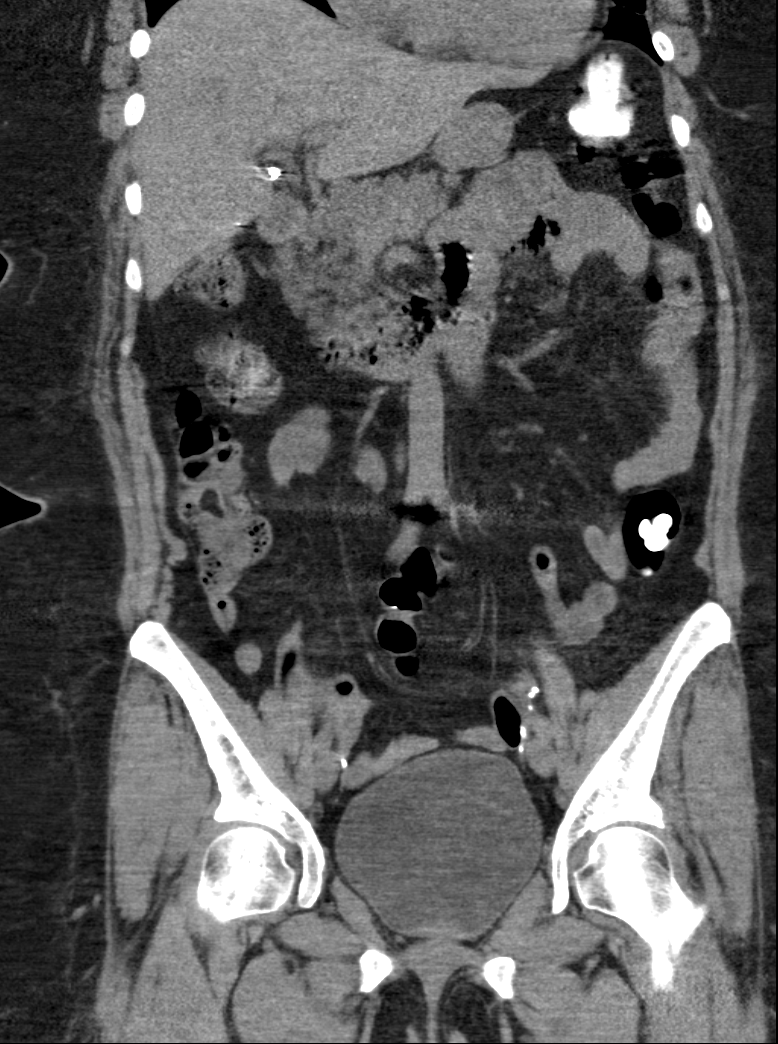
[im 61/109  soft-tissue]
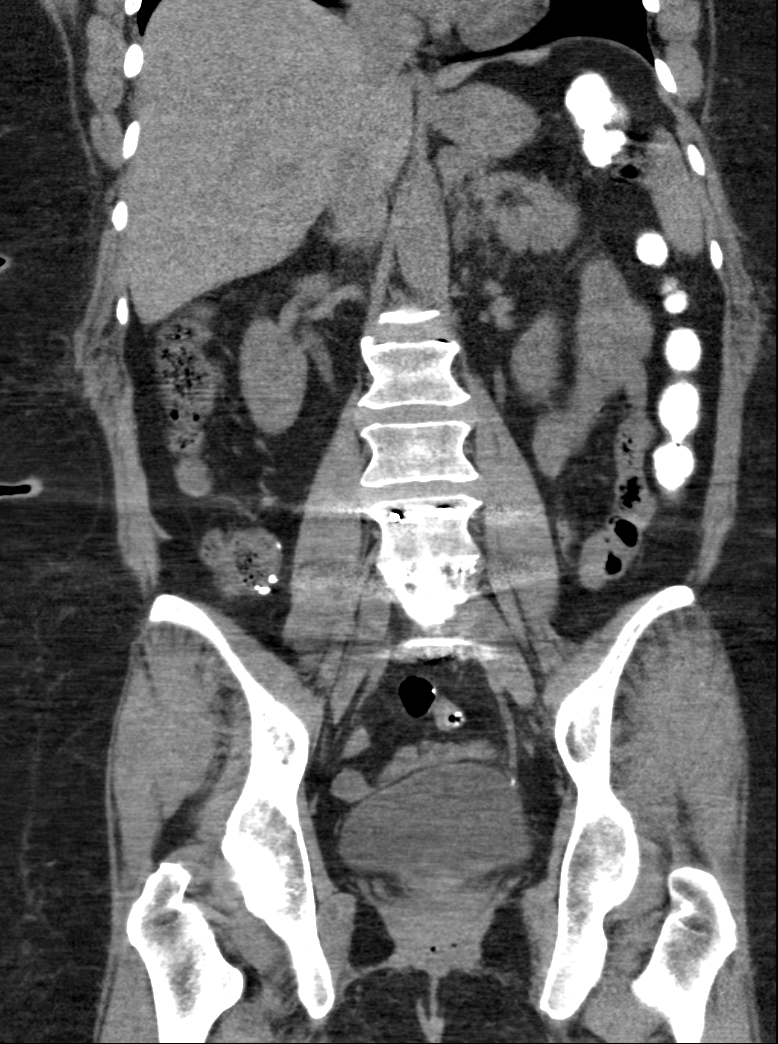

[12 of 46 positions shown; findings below may reference images not displayed]

FINDINGS: Lower chest:  No contributory findings.

Hepatobiliary: No focal liver abnormality.Cholecystectomy.

Pancreas: Unremarkable.

Spleen: Unremarkable.

Adrenals/Urinary Tract: Negative adrenals. No hydronephrosis or
stone. Unremarkable bladder.

Stomach/Bowel: No obstruction. No inflammatory changes. Status post
appendectomy. Oral contrast in the colon from previous CT.

Vascular/Lymphatic: No acute vascular abnormality. No mass or
adenopathy.

Reproductive:Hysterectomy.  Negative adnexae.

Other: No ascites or pneumoperitoneum.

Musculoskeletal: L4-5 and L5-S1 discectomy and solid bony fusion.
Advanced adjacent segment facet arthropathy with L3-4 grade 1
anterolisthesis.
IMPRESSION: No acute finding or change from 2 days prior.

## 2018-03-27 ENCOUNTER — Ambulatory Visit (INDEPENDENT_AMBULATORY_CARE_PROVIDER_SITE_OTHER): Payer: 59 | Admitting: Family Medicine

## 2018-03-27 ENCOUNTER — Encounter: Payer: Self-pay | Admitting: Family Medicine

## 2018-03-27 ENCOUNTER — Encounter

## 2018-03-27 VITALS — BP 150/63 | HR 68 | Temp 97.7°F | Resp 16 | Ht 65.0 in | Wt 227.0 lb

## 2018-03-27 DIAGNOSIS — M158 Other polyosteoarthritis: Secondary | ICD-10-CM | POA: Diagnosis not present

## 2018-03-27 DIAGNOSIS — Z0000 Encounter for general adult medical examination without abnormal findings: Secondary | ICD-10-CM

## 2018-03-27 DIAGNOSIS — Z23 Encounter for immunization: Secondary | ICD-10-CM

## 2018-03-27 LAB — CBC WITH DIFFERENTIAL/PLATELET
BASOS ABS: 0.1 10*3/uL (ref 0.0–0.1)
Basophils Relative: 0.7 % (ref 0.0–3.0)
Eosinophils Absolute: 0.1 10*3/uL (ref 0.0–0.7)
Eosinophils Relative: 1.4 % (ref 0.0–5.0)
HEMATOCRIT: 42 % (ref 36.0–46.0)
Hemoglobin: 14.2 g/dL (ref 12.0–15.0)
LYMPHS PCT: 25.3 % (ref 12.0–46.0)
Lymphs Abs: 2.2 10*3/uL (ref 0.7–4.0)
MCHC: 33.7 g/dL (ref 30.0–36.0)
MCV: 95.1 fl (ref 78.0–100.0)
MONOS PCT: 6.5 % (ref 3.0–12.0)
Monocytes Absolute: 0.6 10*3/uL (ref 0.1–1.0)
Neutro Abs: 5.6 10*3/uL (ref 1.4–7.7)
Neutrophils Relative %: 66.1 % (ref 43.0–77.0)
Platelets: 290 10*3/uL (ref 150.0–400.0)
RBC: 4.41 Mil/uL (ref 3.87–5.11)
RDW: 12.6 % (ref 11.5–15.5)
WBC: 8.5 10*3/uL (ref 4.0–10.5)

## 2018-03-27 LAB — COMPREHENSIVE METABOLIC PANEL
ALBUMIN: 4.3 g/dL (ref 3.5–5.2)
ALK PHOS: 124 U/L — AB (ref 39–117)
ALT: 13 U/L (ref 0–35)
AST: 18 U/L (ref 0–37)
BILIRUBIN TOTAL: 0.5 mg/dL (ref 0.2–1.2)
BUN: 14 mg/dL (ref 6–23)
CALCIUM: 9.6 mg/dL (ref 8.4–10.5)
CO2: 27 mEq/L (ref 19–32)
Chloride: 106 mEq/L (ref 96–112)
Creatinine, Ser: 0.76 mg/dL (ref 0.40–1.20)
GFR: 81.98 mL/min (ref >60.00)
Glucose, Bld: 91 mg/dL (ref 70–99)
Potassium: 4.3 mEq/L (ref 3.5–5.1)
Sodium: 141 mEq/L (ref 135–145)
Total Protein: 7.1 g/dL (ref 6.0–8.3)

## 2018-03-27 LAB — LIPID PANEL
CHOLESTEROL: 224 mg/dL — AB (ref 0–200)
HDL: 37.5 mg/dL — AB (ref >39.00)
LDL Cholesterol: 150 mg/dL — ABNORMAL HIGH (ref 0–99)
NONHDL: 186.35
TRIGLYCERIDES: 181 mg/dL — AB (ref 0.0–149.0)
Total CHOL/HDL Ratio: 6
VLDL: 36.2 mg/dL (ref 0.0–40.0)

## 2018-03-27 LAB — TSH: TSH: 1.68 u[IU]/mL (ref 0.35–4.50)

## 2018-03-27 MED ORDER — TRAMADOL HCL 50 MG PO TABS: 50.0000 mg | ORAL_TABLET | Freq: Four times a day (QID) | ORAL | 0 refills | Status: DC | PRN

## 2018-03-27 NOTE — Patient Instructions (Signed)
Preventive Care 40-64 Years, Female Preventive care refers to lifestyle choices and visits with your health care provider that can promote health and wellness. What does preventive care include?  A yearly physical exam. This is also called an annual well check.  Dental exams once or twice a year.  Routine eye exams. Ask your health care provider how often you should have your eyes checked.  Personal lifestyle choices, including: ? Daily care of your teeth and gums. ? Regular physical activity. ? Eating a healthy diet. ? Avoiding tobacco and drug use. ? Limiting alcohol use. ? Practicing safe sex. ? Taking low-dose aspirin daily starting at age 62. ? Taking vitamin and mineral supplements as recommended by your health care provider. What happens during an annual well check? The services and screenings done by your health care provider during your annual well check will depend on your age, overall health, lifestyle risk factors, and family history of disease. Counseling Your health care provider may ask you questions about your:  Alcohol use.  Tobacco use.  Drug use.  Emotional well-being.  Home and relationship well-being.  Sexual activity.  Eating habits.  Work and work Statistician.  Method of birth control.  Menstrual cycle.  Pregnancy history.  Screening You may have the following tests or measurements:  Height, weight, and BMI.  Blood pressure.  Lipid and cholesterol levels. These may be checked every 5 years, or more frequently if you are over 62 years old.  Skin check.  Lung cancer screening. You may have this screening every year starting at age 62 if you have a 30-pack-year history of smoking and currently smoke or have quit within the past 15 years.  Fecal occult blood test (FOBT) of the stool. You may have this test every year starting at age 62.  Flexible sigmoidoscopy or colonoscopy. You may have a sigmoidoscopy every 5 years or a colonoscopy  every 10 years starting at age 30.  Hepatitis C blood test.  Hepatitis B blood test.  Sexually transmitted disease (STD) testing.  Diabetes screening. This is done by checking your blood sugar (glucose) after you have not eaten for a while (fasting). You may have this done every 1-3 years.  Mammogram. This may be done every 1-2 years. Talk to your health care provider about when you should start having regular mammograms. This may depend on whether you have a family history of breast cancer.  BRCA-related cancer screening. This may be done if you have a family history of breast, ovarian, tubal, or peritoneal cancers.  Pelvic exam and Pap test. This may be done every 3 years starting at age 62. Starting at age 36, this may be done every 5 years if you have a Pap test in combination with an HPV test.  Bone density scan. This is done to screen for osteoporosis. You may have this scan if you are at high risk for osteoporosis.  Discuss your test results, treatment options, and if necessary, the need for more tests with your health care provider. Vaccines Your health care provider may recommend certain vaccines, such as:  Influenza vaccine. This is recommended every year.  Tetanus, diphtheria, and acellular pertussis (Tdap, Td) vaccine. You may need a Td booster every 10 years.  Varicella vaccine. You may need this if you have not been vaccinated.  Zoster vaccine. You may need this after age 62.  Measles, mumps, and rubella (MMR) vaccine. You may need at least one dose of MMR if you were born in  1957 or later. You may also need a second dose.  Pneumococcal 13-valent conjugate (PCV13) vaccine. You may need this if you have certain conditions and were not previously vaccinated.  Pneumococcal polysaccharide (PPSV23) vaccine. You may need one or two doses if you smoke cigarettes or if you have certain conditions.  Meningococcal vaccine. You may need this if you have certain  conditions.  Hepatitis A vaccine. You may need this if you have certain conditions or if you travel or work in places where you may be exposed to hepatitis A.  Hepatitis B vaccine. You may need this if you have certain conditions or if you travel or work in places where you may be exposed to hepatitis B.  Haemophilus influenzae type b (Hib) vaccine. You may need this if you have certain conditions.  Talk to your health care provider about which screenings and vaccines you need and how often you need them. This information is not intended to replace advice given to you by your health care provider. Make sure you discuss any questions you have with your health care provider. Document Released: 05/19/2015 Document Revised: 01/10/2016 Document Reviewed: 02/21/2015 Elsevier Interactive Patient Education  2018 Else[employer] Inc[employer]  

## 2018-03-27 NOTE — Progress Notes (Signed)
Subjective:     Kelli Gould is a 62 y.o. adult and is here for a comprehensive physical exam. The patient reports no problems.  Social History   Socioeconomic History  . Marital status: Married    Spouse name: Not on file  . Number of children: 2  . Years of education: Not on file  . Highest education level: Not on file  Occupational History  . Not on file  Social Needs  . Financial resource strain: Not on file  . Food insecurity:    Worry: Not on file    Inability: Not on file  . Transportation needs:    Medical: Not on file    Non-medical: Not on file  Tobacco Use  . Smoking status: Never Smoker  . Smokeless tobacco: Never Used  Substance and Sexual Activity  . Alcohol use: No  . Drug use: No  . Sexual activity: Yes    Partners: Male  Lifestyle  . Physical activity:    Days per week: Not on file    Minutes per session: Not on file  . Stress: Not on file  Relationships  . Social connections:    Talks on phone: Not on file    Gets together: Not on file    Attends religious service: Not on file    Active member of club or organization: Not on file    Attends meetings of clubs or organizations: Not on file    Relationship status: Not on file  . Intimate partner violence:    Fear of current or ex partner: Not on file    Emotionally abused: Not on file    Physically abused: Not on file    Forced sexual activity: Not on file  Other Topics Concern  . Not on file  Social History Narrative   Exercise--walk---not enough per pt   Lives with husband in a one story home.  Has 2 children.  Works for [employer].     Health Maintenance  Topic Date Due  . MAMMOGRAM  04/16/2017  . TETANUS/TDAP  07/17/2021  . COLONOSCOPY  04/23/2026  . INFLUENZA VACCINE  Completed  . Hepatitis C Screening  Completed  . HIV Screening  Completed    The following portions of the patient's history were reviewed and updated as appropriate: She  has a past medical history of Anxiety, Family  history of breast cancer, and Family history of ovarian cancer. She does not have any pertinent problems on file. She  has a past surgical history that includes Cholecystectomy (1994); Appendectomy (1995); Back surgery; Spine surgery (4098,11,91); and Vaginal hysterectomy (1994). Her family history includes Breast cancer in her other and other; COPD in her father; Congenital heart disease in her father; Crohn's disease in her father; Dementia in her mother; Heart disease in her maternal aunt and maternal grandfather; Heart disease (age of onset: 68) in her mother; Hypertension (age of onset: 10) in her mother; Lung cancer in her other; Ovarian cancer (age of onset: 45) in her maternal grandmother; Skin cancer in her other. She  reports that she has never smoked. She has never used smokeless tobacco. She reports that she does not drink alcohol or use drugs. She has a current medication list which includes the following prescription(s): fluoxetine and tramadol. Current Outpatient Medications on File Prior to Visit  Medication Sig Dispense Refill  . FLUoxetine (PROZAC) 10 MG capsule Take 1 capsule (10 mg total) by mouth daily. 90 capsule 3   No current facility-administered  medications on file prior to visit.    She has No Known Allergies..  Review of Systems   Objective:    BP (!) 150/63   Pulse 68   Temp 97.7 F (36.5 C) (Oral)   Resp 16   Ht 5\' 5"  (1.651 m)   Wt 227 lb (103 kg)   SpO2 100%   BMI 37.77 kg/m  General appearance: alert, cooperative, appears stated age and no distress Head: Normocephalic, without obvious abnormality, atraumatic Eyes: conjunctivae/corneas clear. PERRL, EOM's intact. Fundi benign. Ears: normal TM's and external ear canals both ears Nose: Nares normal. Septum midline. Mucosa normal. No drainage or sinus tenderness. Throat: lips, mucosa, and tongue normal; teeth and gums normal Neck: no adenopathy, no carotid bruit, no JVD, supple, symmetrical, trachea  midline and thyroid not enlarged, symmetric, no tenderness/mass/nodules Back: symmetric, no curvature. ROM normal. No CVA tenderness. Lungs: clear to auscultation bilaterally Breasts: normal appearance, no masses or tenderness Heart: regular rate and rhythm, S1, S2 normal, no murmur, click, rub or gallop Abdomen: soft, non-tender; bowel sounds normal; no masses,  no organomegaly Pelvic: not indicated; status post hysterectomy, negative ROS Extremities: extremities normal, atraumatic, no cyanosis or edema Pulses: 2+ and symmetric Skin: Skin color, texture, turgor normal. No rashes or lesions Lymph nodes: Cervical, supraclavicular, and axillary nodes normal. Neurologic: Alert and oriented X 3, normal strength and tone. Normal symmetric reflexes. Normal coordination and gait    Assessment:    Healthy adult exam.      Plan:  ghm utd Check labs    See After Visit Summary for Counseling Recommendations    1. Other osteoarthritis involving multiple joints stable - traMADol (ULTRAM) 50 MG tablet; Take 1 tablet (50 mg total) by mouth every 6 (six) hours as needed.  Dispense: 30 tablet; Refill: 0  2. Preventative health care See above - CBC with Differential/Platelet - Comprehensive metabolic panel - Lipid panel - TSH  3. Morbid obesity (HCC)  - Amb Ref to Medical Weight Management

## 2018-03-30 NOTE — Addendum Note (Signed)
Addended by: Thelma BargeICHARDSON, Yona Kosek D on: 03/30/2018 07:51 AM   Modules accepted: Orders

## 2018-04-07 ENCOUNTER — Other Ambulatory Visit: Payer: Self-pay | Admitting: Family Medicine

## 2018-04-07 DIAGNOSIS — E7849 Other hyperlipidemia: Secondary | ICD-10-CM

## 2018-05-06 ENCOUNTER — Other Ambulatory Visit: Payer: Self-pay | Admitting: Family Medicine

## 2018-05-06 DIAGNOSIS — M158 Other polyosteoarthritis: Secondary | ICD-10-CM

## 2018-05-08 NOTE — Telephone Encounter (Signed)
Database ran on 03/27/18 and is in media for review  Last written: 03/27/18 Last ov: 03/27/18 Next ov: none Contract: 06/10/18 UDS: 07/07/18

## 2018-07-07 ENCOUNTER — Other Ambulatory Visit (INDEPENDENT_AMBULATORY_CARE_PROVIDER_SITE_OTHER): Payer: 59

## 2018-07-07 DIAGNOSIS — E7849 Other hyperlipidemia: Secondary | ICD-10-CM

## 2018-07-07 LAB — COMPREHENSIVE METABOLIC PANEL
ALBUMIN: 4 g/dL (ref 3.5–5.2)
ALK PHOS: 133 U/L — AB (ref 39–117)
ALT: 14 U/L (ref 0–35)
AST: 19 U/L (ref 0–37)
BUN: 12 mg/dL (ref 6–23)
CO2: 27 mEq/L (ref 19–32)
Calcium: 9.3 mg/dL (ref 8.4–10.5)
Chloride: 108 mEq/L (ref 96–112)
Creatinine, Ser: 0.79 mg/dL (ref 0.40–1.20)
GFR: 73.69 mL/min (ref >60.00)
Glucose, Bld: 91 mg/dL (ref 70–99)
POTASSIUM: 4.2 meq/L (ref 3.5–5.1)
Sodium: 142 mEq/L (ref 135–145)
Total Bilirubin: 0.4 mg/dL (ref 0.2–1.2)
Total Protein: 6.3 g/dL (ref 6.0–8.3)

## 2018-07-07 LAB — LIPID PANEL
CHOLESTEROL: 236 mg/dL — AB (ref 0–200)
HDL: 36.4 mg/dL — ABNORMAL LOW (ref >39.00)
LDL Cholesterol: 163 mg/dL — ABNORMAL HIGH (ref 0–99)
NonHDL: 199.68
Total CHOL/HDL Ratio: 6
Triglycerides: 181 mg/dL — ABNORMAL HIGH (ref 0.0–149.0)
VLDL: 36.2 mg/dL (ref 0.0–40.0)

## 2018-07-12 ENCOUNTER — Other Ambulatory Visit: Payer: Self-pay | Admitting: Family Medicine

## 2018-07-12 DIAGNOSIS — E785 Hyperlipidemia, unspecified: Secondary | ICD-10-CM

## 2018-07-12 DIAGNOSIS — R748 Abnormal levels of other serum enzymes: Secondary | ICD-10-CM

## 2018-07-14 ENCOUNTER — Other Ambulatory Visit: Payer: Self-pay | Admitting: *Deleted

## 2018-07-14 DIAGNOSIS — R748 Abnormal levels of other serum enzymes: Secondary | ICD-10-CM

## 2018-07-14 MED ORDER — ROSUVASTATIN CALCIUM 10 MG PO TABS: 10.0000 mg | ORAL_TABLET | Freq: Every day | ORAL | 2 refills | Status: DC

## 2018-07-15 ENCOUNTER — Other Ambulatory Visit: Payer: Self-pay

## 2018-07-15 ENCOUNTER — Other Ambulatory Visit (INDEPENDENT_AMBULATORY_CARE_PROVIDER_SITE_OTHER): Payer: 59

## 2018-07-15 DIAGNOSIS — R748 Abnormal levels of other serum enzymes: Secondary | ICD-10-CM | POA: Diagnosis not present

## 2018-07-15 LAB — HEPATIC FUNCTION PANEL
ALK PHOS: 131 U/L — AB (ref 39–117)
ALT: 12 U/L (ref 0–35)
AST: 18 U/L (ref 0–37)
Albumin: 4.1 g/dL (ref 3.5–5.2)
BILIRUBIN DIRECT: 0 mg/dL (ref 0.0–0.3)
TOTAL PROTEIN: 6.5 g/dL (ref 6.0–8.3)
Total Bilirubin: 0.4 mg/dL (ref 0.2–1.2)

## 2018-08-18 ENCOUNTER — Other Ambulatory Visit: Payer: Self-pay | Admitting: Family Medicine

## 2018-08-18 DIAGNOSIS — M158 Other polyosteoarthritis: Secondary | ICD-10-CM

## 2018-08-19 NOTE — Telephone Encounter (Signed)
Last written: 05/08/18 Last ov: 03/27/18 Next ov: none Contract: due UDS: due

## 2018-10-13 ENCOUNTER — Telehealth: Payer: Self-pay | Admitting: *Deleted

## 2018-10-13 NOTE — Telephone Encounter (Signed)
Pt states she has a lab appointment tomorrow to recheck her cholesterol level. She only has enough Crestor to last until Friday. She wanted to let PCP know in case she changes the dose or medication, she will need it by Friday.

## 2018-10-13 NOTE — Telephone Encounter (Signed)
Noted  

## 2018-10-14 ENCOUNTER — Other Ambulatory Visit: Payer: Self-pay

## 2018-10-14 ENCOUNTER — Other Ambulatory Visit (INDEPENDENT_AMBULATORY_CARE_PROVIDER_SITE_OTHER): Payer: 59

## 2018-10-14 DIAGNOSIS — R748 Abnormal levels of other serum enzymes: Secondary | ICD-10-CM

## 2018-10-14 DIAGNOSIS — E785 Hyperlipidemia, unspecified: Secondary | ICD-10-CM | POA: Diagnosis not present

## 2018-10-14 LAB — COMPREHENSIVE METABOLIC PANEL
ALT: 14 U/L (ref 0–35)
AST: 18 U/L (ref 0–37)
Albumin: 4.1 g/dL (ref 3.5–5.2)
Alkaline Phosphatase: 116 U/L (ref 39–117)
BUN: 15 mg/dL (ref 6–23)
CO2: 27 mEq/L (ref 19–32)
Calcium: 9.4 mg/dL (ref 8.4–10.5)
Chloride: 108 mEq/L (ref 96–112)
Creatinine, Ser: 0.79 mg/dL (ref 0.40–1.20)
GFR: 73.63 mL/min (ref >60.00)
Glucose, Bld: 98 mg/dL (ref 70–99)
Potassium: 4.4 mEq/L (ref 3.5–5.1)
Sodium: 141 mEq/L (ref 135–145)
Total Bilirubin: 0.4 mg/dL (ref 0.2–1.2)
Total Protein: 6.4 g/dL (ref 6.0–8.3)

## 2018-10-14 LAB — LIPID PANEL
Cholesterol: 128 mg/dL (ref 0–200)
HDL: 34.8 mg/dL — ABNORMAL LOW (ref >39.00)
LDL Cholesterol: 69 mg/dL (ref 0–99)
NonHDL: 93.23
Total CHOL/HDL Ratio: 4
Triglycerides: 122 mg/dL (ref 0.0–149.0)
VLDL: 24.4 mg/dL (ref 0.0–40.0)

## 2018-10-16 ENCOUNTER — Telehealth: Payer: Self-pay | Admitting: Family Medicine

## 2018-10-16 LAB — ALKALINE PHOSPHATASE ISOENZYMES
Alkaline phosphatase (APISO): 120 U/L (ref 37–153)
Bone Isoenzymes: 52 % (ref 28–66)
Intestinal Isoenzymes: 5 % (ref 1–24)
Liver Isoenzymes: 43 % (ref 25–69)

## 2018-10-16 NOTE — Telephone Encounter (Signed)
Copied from [facility] #[id number]. Topic: Quick Communication - Rx Refill/Question >> Oct 16, 2018  2:27 PM [name] wrote: Medication: rosuvastatin (CRESTOR) 10 MG tablet [[id number]]    Pt states that she is completley out. Pt states that she has been waiting on lab results to see if medication needs to be continued. Pt would like to know what she needs to do. Please advise

## 2018-10-19 MED ORDER — ROSUVASTATIN CALCIUM 10 MG PO TABS: 10.0000 mg | ORAL_TABLET | Freq: Every day | ORAL | 1 refills | Status: DC

## 2018-10-19 NOTE — Telephone Encounter (Signed)
Notes recorded by Ann Held, DO on 10/19/2018 at 8:24 AM EDT  Good-- recheck 6 months   Rosuvastatin refilled.

## 2018-10-21 NOTE — Progress Notes (Signed)
Pt views via Mychart

## 2018-12-17 ENCOUNTER — Other Ambulatory Visit: Payer: Self-pay | Admitting: Family Medicine

## 2018-12-17 DIAGNOSIS — M158 Other polyosteoarthritis: Secondary | ICD-10-CM

## 2018-12-17 DIAGNOSIS — F411 Generalized anxiety disorder: Secondary | ICD-10-CM

## 2018-12-18 NOTE — Telephone Encounter (Signed)
Requesting: Tramadol Contract: 06/10/2017 UDS: 01/06/2018, low risk, next screening 07/07/2018 Last OV: 03/27/2018 Next OV: N/A Last Refill: 08/19/2018, #30--0 RF Database:   Please advise

## 2019-02-17 ENCOUNTER — Other Ambulatory Visit: Payer: Self-pay | Admitting: Family Medicine

## 2019-02-17 DIAGNOSIS — Z1231 Encounter for screening mammogram for malignant neoplasm of breast: Secondary | ICD-10-CM

## 2019-04-07 ENCOUNTER — Other Ambulatory Visit: Payer: Self-pay

## 2019-04-07 ENCOUNTER — Ambulatory Visit
Admission: RE | Admit: 2019-04-07 | Discharge: 2019-04-07 | Disposition: A | Discharge status: Home / Self Care | Payer: 59 | Source: Ambulatory Visit | Attending: Family Medicine | Admitting: Family Medicine

## 2019-04-07 DIAGNOSIS — Z1231 Encounter for screening mammogram for malignant neoplasm of breast: Secondary | ICD-10-CM

## 2019-04-17 ENCOUNTER — Other Ambulatory Visit: Payer: Self-pay | Admitting: Family Medicine

## 2019-04-17 DIAGNOSIS — F411 Generalized anxiety disorder: Secondary | ICD-10-CM

## 2019-05-03 ENCOUNTER — Other Ambulatory Visit: Payer: Self-pay

## 2019-05-04 ENCOUNTER — Ambulatory Visit (INDEPENDENT_AMBULATORY_CARE_PROVIDER_SITE_OTHER): Payer: No Typology Code available for payment source | Admitting: Family Medicine

## 2019-05-04 ENCOUNTER — Encounter: Payer: Self-pay | Admitting: Family Medicine

## 2019-05-04 VITALS — BP 140/80 | HR 71 | Temp 97.9°F | Resp 18 | Ht 65.0 in | Wt 221.4 lb

## 2019-05-04 DIAGNOSIS — Z0000 Encounter for general adult medical examination without abnormal findings: Secondary | ICD-10-CM

## 2019-05-04 DIAGNOSIS — R5383 Other fatigue: Secondary | ICD-10-CM

## 2019-05-04 DIAGNOSIS — Z23 Encounter for immunization: Secondary | ICD-10-CM | POA: Diagnosis not present

## 2019-05-04 DIAGNOSIS — E785 Hyperlipidemia, unspecified: Secondary | ICD-10-CM | POA: Diagnosis not present

## 2019-05-04 DIAGNOSIS — M158 Other polyosteoarthritis: Secondary | ICD-10-CM | POA: Diagnosis not present

## 2019-05-04 MED ORDER — TRAMADOL HCL 50 MG PO TABS: 50.0000 mg | ORAL_TABLET | Freq: Four times a day (QID) | ORAL | 0 refills | Status: DC | PRN

## 2019-05-04 NOTE — Progress Notes (Signed)
Subjective:     Kelli Gould is a 63 y.o. adult and is here for a comprehensive physical exam. The patient reports no problems.  Social History   Socioeconomic History  . Marital status: Married    Spouse name: Not on file  . Number of children: 2  . Years of education: Not on file  . Highest education level: Not on file  Occupational History  . Not on file  Tobacco Use  . Smoking status: Never Smoker  . Smokeless tobacco: Never Used  Substance and Sexual Activity  . Alcohol use: No  . Drug use: No  . Sexual activity: Yes    Partners: Male  Other Topics Concern  . Not on file  Social History Narrative   Exercise--walk---not enough per pt   Lives with husband in a one story home.  Has 2 children.  Works for [employer].     Social Determinants of Health   Financial Resource Strain:   . Difficulty of Paying Living Expenses: Not on file  Food Insecurity:   . Worried About Programme researcher, broadcasting/film/video in the Last Year: Not on file  . Ran Out of Food in the Last Year: Not on file  Transportation Needs:   . Lack of Transportation (Medical): Not on file  . Lack of Transportation (Non-Medical): Not on file  Physical Activity:   . Days of Exercise per Week: Not on file  . Minutes of Exercise per Session: Not on file  Stress:   . Feeling of Stress : Not on file  Social Connections:   . Frequency of Communication with Friends and Family: Not on file  . Frequency of Social Gatherings with Friends and Family: Not on file  . Attends Religious Services: Not on file  . Active Member of Clubs or Organizations: Not on file  . Attends Banker Meetings: Not on file  . Marital Status: Not on file  Intimate Partner Violence:   . Fear of Current or Ex-Partner: Not on file  . Emotionally Abused: Not on file  . Physically Abused: Not on file  . Sexually Abused: Not on file   Health Maintenance  Topic Date Due  . INFLUENZA VACCINE  12/05/2018  . MAMMOGRAM  04/06/2020  .  TETANUS/TDAP  07/17/2021  . COLONOSCOPY  04/23/2026  . Hepatitis C Screening  Completed  . HIV Screening  Completed    The following portions of the patient's history were reviewed and updated as appropriate: She  has a past medical history of Anxiety, Family history of breast cancer, and Family history of ovarian cancer. She does not have any pertinent problems on file. She  has a past surgical history that includes Cholecystectomy (1994); Appendectomy (1995); Back surgery; Spine surgery (6387,56,43); and Vaginal hysterectomy (1994). Her family history includes Breast cancer in some other family members; COPD in her father; Congenital heart disease in her father; Crohn's disease in her father; Dementia in her mother; Heart disease in her maternal aunt and maternal grandfather; Heart disease (age of onset: 47) in her mother; Hypertension (age of onset: 52) in her mother; Lung cancer in an other family member; Ovarian cancer (age of onset: 14) in her maternal grandmother; Skin cancer in an other family member. She  reports that she has never smoked. She has never used smokeless tobacco. She reports that she does not drink alcohol or use drugs. She has a current medication list which includes the following prescription(s): fluoxetine, rosuvastatin, and tramadol. Current Outpatient  Medications on File Prior to Visit  Medication Sig Dispense Refill  . FLUoxetine (PROZAC) 10 MG capsule Take 1 capsule (10 mg total) by mouth daily. 90 capsule 0  . rosuvastatin (CRESTOR) 10 MG tablet Take 1 tablet (10 mg total) by mouth daily. 90 tablet 0   No current facility-administered medications on file prior to visit.   She has No Known Allergies..  Review of Systems Review of Systems  Constitutional: Negative for activity change, appetite change and fatigue.  HENT: Negative for hearing loss, congestion, tinnitus and ear discharge.  dentist q93m Eyes: Negative for visual disturbance (see optho q1y -- vision  corrected to 20/20 with glasses).  Respiratory: Negative for cough, chest tightness and shortness of breath.   Cardiovascular: Negative for chest pain, palpitations and leg swelling.  Gastrointestinal: Negative for abdominal pain, diarrhea, constipation and abdominal distention.  Genitourinary: Negative for urgency, frequency, decreased urine volume and difficulty urinating.  Musculoskeletal: Negative for back pain, arthralgias and gait problem.  Skin: Negative for color change, pallor and rash.  Neurological: Negative for dizziness, light-headedness, numbness and headaches.  Hematological: Negative for adenopathy. Does not bruise/bleed easily.  Psychiatric/Behavioral: Negative for suicidal ideas, confusion, sleep disturbance, self-injury, dysphoric mood, decreased concentration and agitation.       Objective:    BP 140/80 (BP Location: Right Arm, Patient Position: Sitting, Cuff Size: Large)   Pulse 71   Temp 97.9 F (36.6 C) (Temporal)   Resp 18   Ht 5\' 5"  (1.651 m)   Wt 221 lb 6.4 oz (100.4 kg)   SpO2 98%   BMI 36.84 kg/m  General appearance: alert, cooperative, appears stated age and no distress Head: Normocephalic, without obvious abnormality, atraumatic Eyes: conjunctivae/corneas clear. PERRL, EOM's intact. Fundi benign. Ears: normal TM's and external ear canals both ears  Neck: no adenopathy, no carotid bruit, no JVD, supple, symmetrical, trachea midline and thyroid not enlarged, symmetric, no tenderness/mass/nodules Back: symmetric, no curvature. ROM normal. No CVA tenderness. Lungs: clear to auscultation bilaterally Breasts: gyn Heart: regular rate and rhythm, S1, S2 normal, no murmur, click, rub or gallop Abdomen: soft, non-tender; bowel sounds normal; no masses,  no organomegaly Pelvic: deferred--gyn Extremities: extremities normal, atraumatic, no cyanosis or edema Pulses: 2+ and symmetric Skin: Skin color, texture, turgor normal. No rashes or lesions Lymph nodes:  Cervical, supraclavicular, and axillary nodes normal. Neurologic: Alert and oriented X 3, normal strength and tone. Normal symmetric reflexes. Normal coordination and gait    Assessment:    Healthy adult exam.      Plan:     ghm utd Check labs See After Visit Summary for Counseling Recommendations    1. Other osteoarthritis involving multiple joints stable- traMADol (ULTRAM) 50 MG tablet; Take 1 tablet (50 mg total) by mouth every 6 (six) hours as needed.  Dispense: 30 tablet; Refill: 0  2. Need for influenza vaccination   - Flu Vaccine QUAD 6+ mos PF IM (Fluarix Quad PF)  3. Preventative health care See above - CBC with Differential - Lipid panel - TSH - Comprehensive metabolic panel  4. Hyperlipidemia, unspecified hyperlipidemia type Tolerating statin, encouraged heart healthy diet, avoid trans fats, minimize simple carbs and saturated fats. Increase exercise as tolerated - Lipid panel - Comprehensive metabolic panel  5. Other fatigue Check labs  - Vitamin B12 - Vitamin D (25 hydroxy)

## 2019-05-04 NOTE — Patient Instructions (Signed)

## 2019-05-05 LAB — COMPREHENSIVE METABOLIC PANEL
ALT: 15 U/L (ref 0–35)
AST: 23 U/L (ref 0–37)
Albumin: 4.2 g/dL (ref 3.5–5.2)
Alkaline Phosphatase: 119 U/L — ABNORMAL HIGH (ref 39–117)
BUN: 16 mg/dL (ref 6–23)
CO2: 26 mEq/L (ref 19–32)
Calcium: 9.5 mg/dL (ref 8.4–10.5)
Chloride: 106 mEq/L (ref 96–112)
Creatinine, Ser: 0.71 mg/dL (ref 0.40–1.20)
GFR: 83.13 mL/min (ref >60.00)
Glucose, Bld: 90 mg/dL (ref 70–99)
Potassium: 4.1 mEq/L (ref 3.5–5.1)
Sodium: 140 mEq/L (ref 135–145)
Total Bilirubin: 0.5 mg/dL (ref 0.2–1.2)
Total Protein: 6.5 g/dL (ref 6.0–8.3)

## 2019-05-05 LAB — CBC WITH DIFFERENTIAL/PLATELET
Basophils Absolute: 0.1 10*3/uL (ref 0.0–0.1)
Basophils Relative: 0.9 % (ref 0.0–3.0)
Eosinophils Absolute: 0.1 10*3/uL (ref 0.0–0.7)
Eosinophils Relative: 1.4 % (ref 0.0–5.0)
HCT: 38.9 % (ref 36.0–46.0)
Hemoglobin: 12.9 g/dL (ref 12.0–15.0)
Lymphocytes Relative: 25.1 % (ref 12.0–46.0)
Lymphs Abs: 2 10*3/uL (ref 0.7–4.0)
MCHC: 33.3 g/dL (ref 30.0–36.0)
MCV: 96.7 fl (ref 78.0–100.0)
Monocytes Absolute: 0.5 10*3/uL (ref 0.1–1.0)
Monocytes Relative: 5.9 % (ref 3.0–12.0)
Neutro Abs: 5.4 10*3/uL (ref 1.4–7.7)
Neutrophils Relative %: 66.7 % (ref 43.0–77.0)
Platelets: 231 10*3/uL (ref 150.0–400.0)
RBC: 4.02 Mil/uL (ref 3.87–5.11)
RDW: 12.6 % (ref 11.5–15.5)
WBC: 8 10*3/uL (ref 4.0–10.5)

## 2019-05-05 LAB — LIPID PANEL
Cholesterol: 126 mg/dL (ref 0–200)
HDL: 42.1 mg/dL (ref >39.00)
LDL Cholesterol: 68 mg/dL (ref 0–99)
NonHDL: 83.76
Total CHOL/HDL Ratio: 3
Triglycerides: 78 mg/dL (ref 0.0–149.0)
VLDL: 15.6 mg/dL (ref 0.0–40.0)

## 2019-05-05 LAB — VITAMIN D 25 HYDROXY (VIT D DEFICIENCY, FRACTURES): VITD: 26.51 ng/mL — ABNORMAL LOW (ref 30.00–100.00)

## 2019-05-05 LAB — VITAMIN B12: Vitamin B-12: 257 pg/mL (ref 211–911)

## 2019-05-05 LAB — TSH: TSH: 1.72 u[IU]/mL (ref 0.35–4.50)

## 2019-05-06 ENCOUNTER — Other Ambulatory Visit: Payer: Self-pay

## 2019-05-06 MED ORDER — VITAMIN D (ERGOCALCIFEROL) 1.25 MG (50000 UNIT) PO CAPS: 50000.0000 [IU] | ORAL_CAPSULE | ORAL | 0 refills | Status: DC

## 2019-07-12 ENCOUNTER — Other Ambulatory Visit: Payer: Self-pay | Admitting: Family Medicine

## 2019-07-21 ENCOUNTER — Encounter: Payer: Self-pay | Admitting: Family Medicine

## 2019-07-22 ENCOUNTER — Other Ambulatory Visit: Payer: Self-pay | Admitting: Family Medicine

## 2019-08-19 ENCOUNTER — Other Ambulatory Visit: Payer: Self-pay

## 2019-08-19 ENCOUNTER — Ambulatory Visit (INDEPENDENT_AMBULATORY_CARE_PROVIDER_SITE_OTHER): Payer: 59 | Admitting: Family Medicine

## 2019-08-19 ENCOUNTER — Encounter: Payer: Self-pay | Admitting: Family Medicine

## 2019-08-19 VITALS — BP 132/78 | HR 72 | Temp 97.0°F | Resp 18 | Ht 65.0 in | Wt 227.6 lb

## 2019-08-19 DIAGNOSIS — F418 Other specified anxiety disorders: Secondary | ICD-10-CM

## 2019-08-19 DIAGNOSIS — M158 Other polyosteoarthritis: Secondary | ICD-10-CM | POA: Diagnosis not present

## 2019-08-19 DIAGNOSIS — Z79899 Other long term (current) drug therapy: Secondary | ICD-10-CM

## 2019-08-19 MED ORDER — TRAMADOL HCL 50 MG PO TABS: 50.0000 mg | ORAL_TABLET | Freq: Four times a day (QID) | ORAL | 0 refills | Status: DC | PRN

## 2019-08-19 MED ORDER — DESVENLAFAXINE SUCCINATE ER 50 MG PO TB24: 50.0000 mg | ORAL_TABLET | Freq: Every day | ORAL | 3 refills | Status: DC

## 2019-08-19 NOTE — Addendum Note (Signed)
Addended by: Roxanne Gates on: 08/19/2019 03:59 PM   Modules accepted: Orders

## 2019-08-19 NOTE — Progress Notes (Signed)
Patient ID: Kelli Gould, adult    DOB: 06-Jul-1955  Age: 64 y.o. MRN: [id number]    Subjective:  Subjective  HPI Kelli Gould presents for f/u depression and anxiety  She has been laughing at inappropriate times and feels it was the prozac  Review of Systems  Constitutional: Negative for appetite change, diaphoresis, fatigue and unexpected weight change.  Eyes: Negative for pain, redness and visual disturbance.  Respiratory: Negative for cough, chest tightness, shortness of breath and wheezing.   Cardiovascular: Negative for chest pain, palpitations and leg swelling.  Endocrine: Negative for cold intolerance, heat intolerance, polydipsia, polyphagia and polyuria.  Genitourinary: Negative for difficulty urinating, dysuria and frequency.  Neurological: Negative for dizziness, light-headedness, numbness and headaches.  Psychiatric/Behavioral: Positive for dysphoric mood. Negative for self-injury, sleep disturbance and suicidal ideas. The patient is nervous/anxious.     History Past Medical History:  Diagnosis Date  . Anxiety   . Family history of breast cancer   . Family history of ovarian cancer     She has a past surgical history that includes Cholecystectomy (1994); Appendectomy (1995); Back surgery; Spine surgery (9242,68,34); and Vaginal hysterectomy (1994).   Her family history includes Breast cancer in some other family members; COPD in her father; Congenital heart disease in her father; Crohn's disease in her father; Dementia in her mother; Heart disease in her maternal aunt and maternal grandfather; Heart disease (age of onset: 14) in her mother; Hypertension (age of onset: 56) in her mother; Lung cancer in an other family member; Ovarian cancer (age of onset: 66) in her maternal grandmother; Skin cancer in an other family member.She reports that she has never smoked. She has never used smokeless tobacco. She reports that she does not drink alcohol or use drugs.  Current  Outpatient Medications on File Prior to Visit  Medication Sig Dispense Refill  . rosuvastatin (CRESTOR) 10 MG tablet TAKE 1 TABLET BY MOUTH EVERY DAY 90 tablet 1  . Vitamin D, Ergocalciferol, (DRISDOL) 1.25 MG (50000 UNIT) CAPS capsule TAKE 1 CAPSULE (50,000 UNITS TOTAL) BY MOUTH EVERY 7 (SEVEN) DAYS. 12 capsule 0   No current facility-administered medications on file prior to visit.     Objective:  Objective  Physical Exam Vitals and nursing note reviewed.  Constitutional:      Appearance: She is well-developed.  HENT:     Head: Normocephalic and atraumatic.  Eyes:     Conjunctiva/sclera: Conjunctivae normal.  Neck:     Thyroid: No thyromegaly.     Vascular: No carotid bruit or JVD.  Cardiovascular:     Rate and Rhythm: Normal rate and regular rhythm.     Heart sounds: Normal heart sounds. No murmur.  Pulmonary:     Effort: Pulmonary effort is normal. No respiratory distress.     Breath sounds: Normal breath sounds. No wheezing or rales.  Chest:     Chest wall: No tenderness.  Musculoskeletal:     Cervical back: Normal range of motion and neck supple.  Neurological:     Mental Status: She is alert and oriented to person, place, and time.  Psychiatric:        Attention and Perception: Attention and perception normal.        Mood and Affect: Mood is anxious and depressed.        Speech: Speech normal.        Behavior: Behavior normal.        Cognition and Memory: Cognition normal.  Judgment: Judgment normal.    BP 132/78 (BP Location: Right Arm, Patient Position: Sitting, Cuff Size: Large)   Pulse 72   Temp (!) 97 F (36.1 C) (Temporal)   Resp 18   Ht 5\' 5"  (1.651 m)   Wt 227 lb 9.6 oz (103.2 kg)   SpO2 98%   BMI 37.87 kg/m  Wt Readings from Last 3 Encounters:  08/19/19 227 lb 9.6 oz (103.2 kg)  05/04/19 221 lb 6.4 oz (100.4 kg)  03/27/18 227 lb (103 kg)     Lab Results  Component Value Date   WBC 8.0 05/04/2019   HGB 12.9 05/04/2019   HCT 38.9  05/04/2019   PLT 231.0 05/04/2019   GLUCOSE 90 05/04/2019   CHOL 126 05/04/2019   TRIG 78.0 05/04/2019   HDL 42.10 05/04/2019   LDLDIRECT 138.8 10/16/2012   LDLCALC 68 05/04/2019   ALT 15 05/04/2019   AST 23 05/04/2019   NA 140 05/04/2019   K 4.1 05/04/2019   CL 106 05/04/2019   CREATININE 0.71 05/04/2019   BUN 16 05/04/2019   CO2 26 05/04/2019   TSH 1.72 05/04/2019   INR 0.9 05/08/2007   HGBA1C 5.7 04/18/2016    MM 3D SCREEN BREAST BILATERAL  Result Date: 04/07/2019 CLINICAL DATA:  Screening. EXAM: DIGITAL SCREENING BILATERAL MAMMOGRAM WITH TOMO AND CAD COMPARISON:  Previous exam(s). ACR Breast Density Category c: The breast tissue is heterogeneously dense, which may obscure small masses. FINDINGS: There are no findings suspicious for malignancy. Images were processed with CAD. IMPRESSION: No mammographic evidence of malignancy. A result letter of this screening mammogram will be mailed directly to the patient. RECOMMENDATION: Screening mammogram in one year. (Code:SM-B-01Y) BI-RADS CATEGORY  1: Negative. Electronically Signed   By: Kelli Gould M.D.   On: 04/07/2019 12:21     Assessment & Plan:  Plan  I have discontinued Kelli Gould. Herd's FLUoxetine. I am also having her start on desvenlafaxine. Additionally, I am having her maintain her rosuvastatin, Vitamin D (Ergocalciferol), and traMADol.  Meds ordered this encounter  Medications  . traMADol (ULTRAM) 50 MG tablet    Sig: Take 1 tablet (50 mg total) by mouth every 6 (six) hours as needed.    Dispense:  30 tablet    Refill:  0    Not to exceed 5 additional fills before 02/15/2019  . desvenlafaxine (PRISTIQ) 50 MG 24 hr tablet    Sig: Take 1 tablet (50 mg total) by mouth daily.    Dispense:  30 tablet    Refill:  3    Problem List Items Addressed This Visit      Unprioritized   Degenerative joint disease involving multiple joints   Relevant Medications   traMADol (ULTRAM) 50 MG tablet    Other Visit  Diagnoses    Depression with anxiety    -  Primary   Relevant Medications   desvenlafaxine (PRISTIQ) 50 MG 24 hr tablet    d/c prozac Start pristiq F/u 1 month or sooner prn   Follow-up: Return in about 4 weeks (around 09/16/2019), or if symptoms worsen or fail to improve, for depression, anxiety.  Ann Held, DO

## 2019-08-19 NOTE — Patient Instructions (Signed)

## 2019-08-21 LAB — PAIN MGMT, PROFILE 8 W/CONF, U
6 Acetylmorphine: NEGATIVE ng/mL
Alcohol Metabolites: NEGATIVE ng/mL (ref <500)
Amphetamines: NEGATIVE ng/mL
Benzodiazepines: NEGATIVE ng/mL
Buprenorphine, Urine: NEGATIVE ng/mL
Cocaine Metabolite: NEGATIVE ng/mL
Creatinine: 142.3 mg/dL
MDMA: NEGATIVE ng/mL
Marijuana Metabolite: NEGATIVE ng/mL
Opiates: NEGATIVE ng/mL
Oxidant: NEGATIVE ug/mL
Oxycodone: NEGATIVE ng/mL
pH: 5.8 (ref 4.5–9.0)

## 2019-08-31 ENCOUNTER — Other Ambulatory Visit: Payer: Self-pay | Admitting: Family Medicine

## 2019-08-31 DIAGNOSIS — F411 Generalized anxiety disorder: Secondary | ICD-10-CM

## 2019-09-10 ENCOUNTER — Other Ambulatory Visit: Payer: Self-pay | Admitting: Family Medicine

## 2019-09-10 DIAGNOSIS — F418 Other specified anxiety disorders: Secondary | ICD-10-CM

## 2019-09-16 ENCOUNTER — Encounter: Payer: Self-pay | Admitting: Family Medicine

## 2019-09-16 ENCOUNTER — Other Ambulatory Visit: Payer: Self-pay

## 2019-09-16 ENCOUNTER — Ambulatory Visit (INDEPENDENT_AMBULATORY_CARE_PROVIDER_SITE_OTHER): Payer: 59 | Admitting: Family Medicine

## 2019-09-16 VITALS — BP 140/86 | HR 75 | Temp 97.9°F | Resp 18 | Ht 65.0 in | Wt 225.0 lb

## 2019-09-16 DIAGNOSIS — E559 Vitamin D deficiency, unspecified: Secondary | ICD-10-CM | POA: Diagnosis not present

## 2019-09-16 DIAGNOSIS — E538 Deficiency of other specified B group vitamins: Secondary | ICD-10-CM

## 2019-09-16 DIAGNOSIS — F418 Other specified anxiety disorders: Secondary | ICD-10-CM | POA: Diagnosis not present

## 2019-09-16 DIAGNOSIS — E785 Hyperlipidemia, unspecified: Secondary | ICD-10-CM

## 2019-09-16 DIAGNOSIS — F411 Generalized anxiety disorder: Secondary | ICD-10-CM

## 2019-09-16 LAB — COMPREHENSIVE METABOLIC PANEL
ALT: 15 U/L (ref 0–35)
AST: 22 U/L (ref 0–37)
Albumin: 4.2 g/dL (ref 3.5–5.2)
Alkaline Phosphatase: 118 U/L — ABNORMAL HIGH (ref 39–117)
BUN: 18 mg/dL (ref 6–23)
CO2: 30 mEq/L (ref 19–32)
Calcium: 9.5 mg/dL (ref 8.4–10.5)
Chloride: 106 mEq/L (ref 96–112)
Creatinine, Ser: 0.67 mg/dL (ref 0.40–1.20)
GFR: 88.78 mL/min (ref >60.00)
Glucose, Bld: 105 mg/dL — ABNORMAL HIGH (ref 70–99)
Potassium: 4 mEq/L (ref 3.5–5.1)
Sodium: 141 mEq/L (ref 135–145)
Total Bilirubin: 0.3 mg/dL (ref 0.2–1.2)
Total Protein: 6.4 g/dL (ref 6.0–8.3)

## 2019-09-16 LAB — LIPID PANEL
Cholesterol: 134 mg/dL (ref 0–200)
HDL: 37.8 mg/dL — ABNORMAL LOW (ref >39.00)
LDL Cholesterol: 61 mg/dL (ref 0–99)
NonHDL: 96.19
Total CHOL/HDL Ratio: 4
Triglycerides: 176 mg/dL — ABNORMAL HIGH (ref 0.0–149.0)
VLDL: 35.2 mg/dL (ref 0.0–40.0)

## 2019-09-16 LAB — VITAMIN B12: Vitamin B-12: >1500 pg/mL — ABNORMAL HIGH (ref 211–911)

## 2019-09-16 LAB — VITAMIN D 25 HYDROXY (VIT D DEFICIENCY, FRACTURES): VITD: 63.8 ng/mL (ref 30.00–100.00)

## 2019-09-16 NOTE — Assessment & Plan Note (Signed)
Stable con't pristiq

## 2019-09-16 NOTE — Assessment & Plan Note (Signed)
Check labs 

## 2019-09-16 NOTE — Assessment & Plan Note (Signed)
Encouraged heart healthy diet, increase exercise, avoid trans fats, consider a krill oil cap daily 

## 2019-09-16 NOTE — Patient Instructions (Signed)

## 2019-09-16 NOTE — Progress Notes (Signed)
Patient ID: Kelli Gould, adult    DOB: 1955/07/01  Age: 64 y.o. MRN: [id number]    Subjective:  Subjective  HPI Kelli Gould presents for f/u anxiety.  She is doing much better with the pristiq She would also like to get her labs done today   No complaints   Review of Systems  Constitutional: Negative for appetite change, diaphoresis, fatigue and unexpected weight change.  Eyes: Negative for pain, redness and visual disturbance.  Respiratory: Negative for cough, chest tightness, shortness of breath and wheezing.   Cardiovascular: Negative for chest pain, palpitations and leg swelling.  Endocrine: Negative for cold intolerance, heat intolerance, polydipsia, polyphagia and polyuria.  Genitourinary: Negative for difficulty urinating, dysuria and frequency.  Neurological: Negative for dizziness, light-headedness, numbness and headaches.  Psychiatric/Behavioral: Positive for decreased concentration and dysphoric mood. Negative for sleep disturbance and suicidal ideas. The patient is nervous/anxious.     History Past Medical History:  Diagnosis Date  . Anxiety   . Family history of breast cancer   . Family history of ovarian cancer     She has a past surgical history that includes Cholecystectomy (1994); Appendectomy (1995); Back surgery; Spine surgery (7096,28,36); and Vaginal hysterectomy (1994).   Her family history includes Breast cancer in some other family members; COPD in her father; Congenital heart disease in her father; Crohn's disease in her father; Dementia in her mother; Heart disease in her maternal aunt and maternal grandfather; Heart disease (age of onset: 54) in her mother; Hypertension (age of onset: 66) in her mother; Lung cancer in an other family member; Ovarian cancer (age of onset: 77) in her maternal grandmother; Skin cancer in an other family member.She reports that she has never smoked. She has never used smokeless tobacco. She reports that she does not  drink alcohol or use drugs.  Current Outpatient Medications on File Prior to Visit  Medication Sig Dispense Refill  . desvenlafaxine (PRISTIQ) 50 MG 24 hr tablet TAKE 1 TABLET BY MOUTH EVERY DAY 90 tablet 1  . rosuvastatin (CRESTOR) 10 MG tablet TAKE 1 TABLET BY MOUTH EVERY DAY 90 tablet 1  . traMADol (ULTRAM) 50 MG tablet Take 1 tablet (50 mg total) by mouth every 6 (six) hours as needed. 30 tablet 0  . Vitamin D, Ergocalciferol, (DRISDOL) 1.25 MG (50000 UNIT) CAPS capsule TAKE 1 CAPSULE (50,000 UNITS TOTAL) BY MOUTH EVERY 7 (SEVEN) DAYS. 12 capsule 0   No current facility-administered medications on file prior to visit.     Objective:  Objective  Physical Exam Vitals and nursing note reviewed.  Constitutional:      Appearance: She is well-developed.  HENT:     Head: Normocephalic and atraumatic.  Eyes:     Conjunctiva/sclera: Conjunctivae normal.  Neck:     Thyroid: No thyromegaly.     Vascular: No carotid bruit or JVD.  Cardiovascular:     Rate and Rhythm: Normal rate and regular rhythm.     Heart sounds: Normal heart sounds. No murmur.  Pulmonary:     Effort: Pulmonary effort is normal. No respiratory distress.     Breath sounds: Normal breath sounds. No wheezing or rales.  Chest:     Chest wall: No tenderness.  Musculoskeletal:     Cervical back: Normal range of motion and neck supple.  Neurological:     Mental Status: She is alert and oriented to person, place, and time.  Psychiatric:        Mood and Affect: Mood normal.  Behavior: Behavior normal.        Thought Content: Thought content normal.        Judgment: Judgment normal.    BP 140/86 (BP Location: Left Arm, Patient Position: Sitting, Cuff Size: Large)   Pulse 75   Temp 97.9 F (36.6 C) (Temporal)   Resp 18   Ht 5\' 5"  (1.651 m)   Wt 225 lb (102.1 kg)   SpO2 99%   BMI 37.44 kg/m  Wt Readings from Last 3 Encounters:  09/16/19 225 lb (102.1 kg)  08/19/19 227 lb 9.6 oz (103.2 kg)  05/04/19 221  lb 6.4 oz (100.4 kg)     Lab Results  Component Value Date   WBC 8.0 05/04/2019   HGB 12.9 05/04/2019   HCT 38.9 05/04/2019   PLT 231.0 05/04/2019   GLUCOSE 90 05/04/2019   CHOL 126 05/04/2019   TRIG 78.0 05/04/2019   HDL 42.10 05/04/2019   LDLDIRECT 138.8 10/16/2012   LDLCALC 68 05/04/2019   ALT 15 05/04/2019   AST 23 05/04/2019   NA 140 05/04/2019   K 4.1 05/04/2019   CL 106 05/04/2019   CREATININE 0.71 05/04/2019   BUN 16 05/04/2019   CO2 26 05/04/2019   TSH 1.72 05/04/2019   INR 0.9 05/08/2007   HGBA1C 5.7 04/18/2016    MM 3D SCREEN BREAST BILATERAL  Result Date: 04/07/2019 CLINICAL DATA:  Screening. EXAM: DIGITAL SCREENING BILATERAL MAMMOGRAM WITH TOMO AND CAD COMPARISON:  Previous exam(s). ACR Breast Density Category c: The breast tissue is heterogeneously dense, which may obscure small masses. FINDINGS: There are no findings suspicious for malignancy. Images were processed with CAD. IMPRESSION: No mammographic evidence of malignancy. A result letter of this screening mammogram will be mailed directly to the patient. RECOMMENDATION: Screening mammogram in one year. (Code:SM-B-01Y) BI-RADS CATEGORY  1: Negative. Electronically Signed   By: Fidela Salisbury M.D.   On: 04/07/2019 12:21     Assessment & Plan:  Plan  I have discontinued Syndi Pua. Diluzio's FLUoxetine. I am also having her maintain her rosuvastatin, Vitamin D (Ergocalciferol), traMADol, and desvenlafaxine.  No orders of the defined types were placed in this encounter.   Problem List Items Addressed This Visit      Unprioritized   Generalized anxiety disorder    Stable con't pristiq       Hyperlipidemia    Encouraged heart healthy diet, increase exercise, avoid trans fats, consider a krill oil cap daily      Relevant Orders   Lipid panel   Comprehensive metabolic panel   Vitamin B 12 deficiency    Check labs       Relevant Orders   Vitamin B12   Vitamin D deficiency    Check labs         Relevant Orders   Vitamin D (25 hydroxy)    Other Visit Diagnoses    Depression with anxiety    -  Primary      Follow-up: Return if symptoms worsen or fail to improve, for annual exam, fasting,  cpe due dec .  Ann Held, DO

## 2019-10-13 ENCOUNTER — Other Ambulatory Visit: Payer: Self-pay | Admitting: Family Medicine

## 2019-10-13 NOTE — Telephone Encounter (Signed)
Last vitamin d was done on 5/13 and was 63.80.  do you want to continue rx strength?

## 2019-11-03 ENCOUNTER — Encounter: Payer: Self-pay | Admitting: Family Medicine

## 2019-11-04 NOTE — Telephone Encounter (Signed)
Ok to switch pack ot 10 mg prozac #30  2 refills

## 2019-11-05 MED ORDER — FLUOXETINE HCL 10 MG PO TABS: 10.0000 mg | ORAL_TABLET | Freq: Every day | ORAL | 2 refills | Status: DC

## 2019-11-27 ENCOUNTER — Other Ambulatory Visit: Payer: Self-pay | Admitting: Family Medicine

## 2019-12-24 ENCOUNTER — Other Ambulatory Visit: Payer: Self-pay | Admitting: Family Medicine

## 2019-12-31 ENCOUNTER — Other Ambulatory Visit: Payer: Self-pay | Admitting: Family Medicine

## 2020-01-07 ENCOUNTER — Other Ambulatory Visit: Payer: Self-pay | Admitting: Family Medicine

## 2020-03-21 ENCOUNTER — Other Ambulatory Visit: Payer: Self-pay | Admitting: Family Medicine

## 2020-03-21 NOTE — Telephone Encounter (Signed)
Would you like Pt to continue Ergocalciferol? Next visit 05/2020.

## 2020-05-07 ENCOUNTER — Other Ambulatory Visit: Payer: Self-pay | Admitting: Family Medicine

## 2020-05-07 DIAGNOSIS — E785 Hyperlipidemia, unspecified: Secondary | ICD-10-CM

## 2020-05-07 DIAGNOSIS — E559 Vitamin D deficiency, unspecified: Secondary | ICD-10-CM

## 2020-05-08 ENCOUNTER — Encounter: Payer: 59 | Admitting: Family Medicine

## 2020-05-26 ENCOUNTER — Other Ambulatory Visit: Payer: Self-pay | Admitting: Family Medicine

## 2020-06-01 ENCOUNTER — Other Ambulatory Visit: Payer: Self-pay

## 2020-06-02 ENCOUNTER — Encounter: Payer: Self-pay | Admitting: Family Medicine

## 2020-06-02 ENCOUNTER — Other Ambulatory Visit: Payer: Self-pay

## 2020-06-02 ENCOUNTER — Ambulatory Visit (INDEPENDENT_AMBULATORY_CARE_PROVIDER_SITE_OTHER): Payer: No Typology Code available for payment source | Admitting: Family Medicine

## 2020-06-02 VITALS — BP 132/82 | HR 80 | Temp 98.7°F | Resp 18 | Ht 65.0 in | Wt 217.0 lb

## 2020-06-02 DIAGNOSIS — E785 Hyperlipidemia, unspecified: Secondary | ICD-10-CM

## 2020-06-02 DIAGNOSIS — E538 Deficiency of other specified B group vitamins: Secondary | ICD-10-CM

## 2020-06-02 DIAGNOSIS — Z0000 Encounter for general adult medical examination without abnormal findings: Secondary | ICD-10-CM | POA: Diagnosis not present

## 2020-06-02 DIAGNOSIS — E559 Vitamin D deficiency, unspecified: Secondary | ICD-10-CM

## 2020-06-02 DIAGNOSIS — E2839 Other primary ovarian failure: Secondary | ICD-10-CM

## 2020-06-02 DIAGNOSIS — R232 Flushing: Secondary | ICD-10-CM | POA: Diagnosis not present

## 2020-06-02 DIAGNOSIS — F419 Anxiety disorder, unspecified: Secondary | ICD-10-CM

## 2020-06-02 LAB — COMPREHENSIVE METABOLIC PANEL
ALT: 14 U/L (ref 0–35)
AST: 18 U/L (ref 0–37)
Albumin: 4.3 g/dL (ref 3.5–5.2)
Alkaline Phosphatase: 117 U/L (ref 39–117)
BUN: 16 mg/dL (ref 6–23)
CO2: 32 mEq/L (ref 19–32)
Calcium: 10 mg/dL (ref 8.4–10.5)
Chloride: 105 mEq/L (ref 96–112)
Creatinine, Ser: 0.8 mg/dL (ref 0.40–1.20)
GFR: 78.03 mL/min (ref >60.00)
Glucose, Bld: 99 mg/dL (ref 70–99)
Potassium: 4.2 mEq/L (ref 3.5–5.1)
Sodium: 142 mEq/L (ref 135–145)
Total Bilirubin: 0.5 mg/dL (ref 0.2–1.2)
Total Protein: 7 g/dL (ref 6.0–8.3)

## 2020-06-02 LAB — CBC WITH DIFFERENTIAL/PLATELET
Basophils Absolute: 0 10*3/uL (ref 0.0–0.1)
Basophils Relative: 0.6 % (ref 0.0–3.0)
Eosinophils Absolute: 0.1 10*3/uL (ref 0.0–0.7)
Eosinophils Relative: 0.7 % (ref 0.0–5.0)
HCT: 41.5 % (ref 36.0–46.0)
Hemoglobin: 13.7 g/dL (ref 12.0–15.0)
Lymphocytes Relative: 24.3 % (ref 12.0–46.0)
Lymphs Abs: 2 10*3/uL (ref 0.7–4.0)
MCHC: 33.1 g/dL (ref 30.0–36.0)
MCV: 97.6 fl (ref 78.0–100.0)
Monocytes Absolute: 0.5 10*3/uL (ref 0.1–1.0)
Monocytes Relative: 6.1 % (ref 3.0–12.0)
Neutro Abs: 5.5 10*3/uL (ref 1.4–7.7)
Neutrophils Relative %: 68.3 % (ref 43.0–77.0)
Platelets: 246 10*3/uL (ref 150.0–400.0)
RBC: 4.25 Mil/uL (ref 3.87–5.11)
RDW: 13.1 % (ref 11.5–15.5)
WBC: 8.1 10*3/uL (ref 4.0–10.5)

## 2020-06-02 LAB — LIPID PANEL
Cholesterol: 127 mg/dL (ref 0–200)
HDL: 43.8 mg/dL (ref >39.00)
LDL Cholesterol: 60 mg/dL (ref 0–99)
NonHDL: 83.28
Total CHOL/HDL Ratio: 3
Triglycerides: 114 mg/dL (ref 0.0–149.0)
VLDL: 22.8 mg/dL (ref 0.0–40.0)

## 2020-06-02 LAB — TSH: TSH: 2.41 u[IU]/mL (ref 0.35–4.50)

## 2020-06-02 LAB — VITAMIN B12: Vitamin B-12: 459 pg/mL (ref 211–911)

## 2020-06-02 MED ORDER — FLUOXETINE HCL 20 MG PO TABS: 20.0000 mg | ORAL_TABLET | Freq: Every day | ORAL | 3 refills | Status: DC

## 2020-06-02 NOTE — Assessment & Plan Note (Signed)
Tolerating statin, encouraged heart healthy diet, avoid trans fats, minimize simple carbs and saturated fats. Increase exercise as tolerated 

## 2020-06-02 NOTE — Patient Instructions (Signed)
Preventive Care 84-65 Years Old, Female Preventive care refers to lifestyle choices and visits with your health care provider that can promote health and wellness. This includes:  A yearly physical exam. This is also called an annual wellness visit.  Regular dental and eye exams.  Immunizations.  Screening for certain conditions.  Healthy lifestyle choices, such as: ? Eating a healthy diet. ? Getting regular exercise. ? Not using drugs or products that contain nicotine and tobacco. ? Limiting alcohol use. What can I expect for my preventive care visit? Physical exam Your health care provider will check your:  Height and weight. These may be used to calculate your BMI (body mass index). BMI is a measurement that tells if you are at a healthy weight.  Heart rate and blood pressure.  Body temperature.  Skin for abnormal spots. Counseling Your health care provider may ask you questions about your:  Past medical problems.  Family's medical history.  Alcohol, tobacco, and drug use.  Emotional well-being.  Home life and relationship well-being.  Sexual activity.  Diet, exercise, and sleep habits.  Work and work Statistician.  Access to firearms.  Method of birth control.  Menstrual cycle.  Pregnancy history. What immunizations do I need? Vaccines are usually given at various ages, according to a schedule. Your health care provider will recommend vaccines for you based on your age, medical history, and lifestyle or other factors, such as travel or where you work.   What tests do I need? Blood tests  Lipid and cholesterol levels. These may be checked every 5 years, or more often if you are over 3 years old.  Hepatitis C test.  Hepatitis B test. Screening  Lung cancer screening. You may have this screening every year starting at age 73 if you have a 30-pack-year history of smoking and currently smoke or have quit within the past 15 years.  Colorectal cancer  screening. ? All adults should have this screening starting at age 52 and continuing until age 17. ? Your health care provider may recommend screening at age 49 if you are at increased risk. ? You will have tests every 1-10 years, depending on your results and the type of screening test.  Diabetes screening. ? This is done by checking your blood sugar (glucose) after you have not eaten for a while (fasting). ? You may have this done every 1-3 years.  Mammogram. ? This may be done every 1-2 years. ? Talk with your health care provider about when you should start having regular mammograms. This may depend on whether you have a family history of breast cancer.  BRCA-related cancer screening. This may be done if you have a family history of breast, ovarian, tubal, or peritoneal cancers.  Pelvic exam and Pap test. ? This may be done every 3 years starting at age 10. ? Starting at age 11, this may be done every 5 years if you have a Pap test in combination with an HPV test. Other tests  STD (sexually transmitted disease) testing, if you are at risk.  Bone density scan. This is done to screen for osteoporosis. You may have this scan if you are at high risk for osteoporosis. Talk with your health care provider about your test results, treatment options, and if necessary, the need for more tests. Follow these instructions at home: Eating and drinking  Eat a diet that includes fresh fruits and vegetables, whole grains, lean protein, and low-fat dairy products.  Take vitamin and mineral supplements  as recommended by your health care provider.  Do not drink alcohol if: ? Your health care provider tells you not to drink. ? You are pregnant, may be pregnant, or are planning to become pregnant.  If you drink alcohol: ? Limit how much you have to 0-1 drink a day. ? Be aware of how much alcohol is in your drink. In the [place] one drink equals one 12 oz bottle of beer (355 mL), one 5 oz glass of  wine (148 mL), or one 1 oz glass of hard liquor (44 mL).   Lifestyle  Take daily care of your teeth and gums. Brush your teeth every morning and night with fluoride toothpaste. Floss one time each day.  Stay active. Exercise for at least 30 minutes 5 or more days each week.  Do not use any products that contain nicotine or tobacco, such as cigarettes, e-cigarettes, and chewing tobacco. If you need help quitting, ask your health care provider.  Do not use drugs.  If you are sexually active, practice safe sex. Use a condom or other form of protection to prevent STIs (sexually transmitted infections).  If you do not wish to become pregnant, use a form of birth control. If you plan to become pregnant, see your health care provider for a prepregnancy visit.  If told by your health care provider, take low-dose aspirin daily starting at age 50.  Find healthy ways to cope with stress, such as: ? Meditation, yoga, or listening to music. ? Journaling. ? Talking to a trusted person. ? Spending time with friends and family. Safety  Always wear your seat belt while driving or riding in a vehicle.  Do not drive: ? If you have been drinking alcohol. Do not ride with someone who has been drinking. ? When you are tired or distracted. ? While texting.  Wear a helmet and other protective equipment during sports activities.  If you have firearms in your house, make sure you follow all gun safety procedures. What's next?  Visit your health care provider once a year for an annual wellness visit.  Ask your health care provider how often you should have your eyes and teeth checked.  Stay up to date on all vaccines. This information is not intended to replace advice given to you by your health care provider. Make sure you discuss any questions you have with your health care provider. Document Revised: 01/25/2020 Document Reviewed: 01/01/2018 [employer] Patient Education  2021 [employer]  

## 2020-06-02 NOTE — Progress Notes (Signed)
Subjective:     Kelli Gould is a 65 y.o. adult and is here for a comprehensive physical exam. The patient reports no problems. She is f/u cholesterol and anxiety as well.   She is struggling with hot flashes and has been more irritable lately   Social History   Socioeconomic History  . Marital status: Married    Spouse name: Not on file  . Number of children: 2  . Years of education: Not on file  . Highest education level: Not on file  Occupational History  . Not on file  Tobacco Use  . Smoking status: Never Smoker  . Smokeless tobacco: Never Used  Vaping Use  . Vaping Use: Never used  Substance and Sexual Activity  . Alcohol use: No  . Drug use: No  . Sexual activity: Yes    Partners: Male  Other Topics Concern  . Not on file  Social History Narrative   Exercise--walk---not enough per pt   Lives with husband in a one story home.  Has 2 children.  Works for [employer].     Social Determinants of Health   Financial Resource Strain: Not on file  Food Insecurity: Not on file  Transportation Needs: Not on file  Physical Activity: Not on file  Stress: Not on file  Social Connections: Not on file  Intimate Partner Violence: Not on file   Health Maintenance  Topic Date Due  . MAMMOGRAM  04/06/2020  . TETANUS/TDAP  07/17/2021  . COLONOSCOPY (Pts 45-74yrs Insurance coverage will need to be confirmed)  04/23/2026  . INFLUENZA VACCINE  Completed  . COVID-19 Vaccine  Completed  . Hepatitis C Screening  Completed  . HIV Screening  Completed    The following portions of the patient's history were reviewed and updated as appropriate:  She  has a past medical history of Anxiety, Family history of breast cancer, and Family history of ovarian cancer. She does not have any pertinent problems on file. She  has a past surgical history that includes Cholecystectomy (1994); Appendectomy (1995); Back surgery; Spine surgery (3419,62,22); and Vaginal hysterectomy (1994). Her family  history includes Breast cancer in some other family members; COPD in her father; Congenital heart disease in her father; Crohn's disease in her father; Dementia in her mother; Heart disease in her maternal aunt and maternal grandfather; Heart disease (age of onset: 29) in her mother; Hypertension (age of onset: 69) in her mother; Lung cancer in an other family member; Ovarian cancer (age of onset: 16) in her maternal grandmother; Skin cancer in an other family member. She  reports that she has never smoked. She has never used smokeless tobacco. She reports that she does not drink alcohol and does not use drugs. She has a current medication list which includes the following prescription(s): fluoxetine, rosuvastatin, tramadol, and vitamin d (ergocalciferol). Current Outpatient Medications on File Prior to Visit  Medication Sig Dispense Refill  . rosuvastatin (CRESTOR) 10 MG tablet TAKE 1 TABLET BY MOUTH EVERY DAY 90 tablet 1  . traMADol (ULTRAM) 50 MG tablet Take 1 tablet (50 mg total) by mouth every 6 (six) hours as needed. 30 tablet 0  . Vitamin D, Ergocalciferol, (DRISDOL) 1.25 MG (50000 UNIT) CAPS capsule TAKE 1 CAPSULE BY MOUTH ONE TIME PER WEEK 12 capsule 0   No current facility-administered medications on file prior to visit.   She has No Known Allergies..  Review of Systems Review of Systems  Constitutional: Negative for activity change, appetite change and  fatigue.  HENT: Negative for hearing loss, congestion, tinnitus and ear discharge.  dentist q34m Eyes: Negative for visual disturbance (see optho q1y -- vision corrected to 20/20 with glasses).  Respiratory: Negative for cough, chest tightness and shortness of breath.   Cardiovascular: Negative for chest pain, palpitations and leg swelling.  Gastrointestinal: Negative for abdominal pain, diarrhea, constipation and abdominal distention.  Genitourinary: Negative for urgency, frequency, decreased urine volume and difficulty urinating.   Musculoskeletal: Negative for back pain, arthralgias and gait problem.  Skin: Negative for color change, pallor and rash.  Neurological: Negative for dizziness, light-headedness, numbness and headaches.  Hematological: Negative for adenopathy. Does not bruise/bleed easily.  Psychiatric/Behavioral: Negative for suicidal ideas, confusion, sleep disturbance, self-injury, dysphoric mood, decreased concentration and agitation.       Objective:    BP 132/82 (BP Location: Right Arm, Patient Position: Sitting, Cuff Size: Normal)   Pulse 80   Temp 98.7 F (37.1 C) (Oral)   Resp 18   Ht 5\' 5"  (1.651 m)   Wt 217 lb (98.4 kg)   SpO2 98%   BMI 36.11 kg/m  General appearance: alert, cooperative, appears stated age and no distress Head: Normocephalic, without obvious abnormality, atraumatic Eyes: negative findings: lids and lashes normal, conjunctivae and sclerae normal and pupils equal, round, reactive to light and accomodation Ears: normal TM's and external ear canals both ears Neck: no adenopathy, no carotid bruit, no JVD, supple, symmetrical, trachea midline and thyroid not enlarged, symmetric, no tenderness/mass/nodules Back: symmetric, no curvature. ROM normal. No CVA tenderness. Lungs: clear to auscultation bilaterally Breasts: gyn Heart: regular rate and rhythm, S1, S2 normal, no murmur, click, rub or gallop Abdomen: soft, non-tender; bowel sounds normal; no masses,  no organomegaly Pelvic: deferred--gyn Extremities: extremities normal, atraumatic, no cyanosis or edema Pulses: 2+ and symmetric Skin: Skin color, texture, turgor normal. No rashes or lesions Lymph nodes: Cervical, supraclavicular, and axillary nodes normal. Neurologic: Alert and oriented X 3, normal strength and tone. Normal symmetric reflexes. Normal coordination and gait    Assessment:    Healthy adult exam.      Plan:    ghm utd Check labs See After Visit Summary for Counseling Recommendations    1.  Preventative health care See above  - TSH - CBC with Differential/Platelet - Lipid panel - Comprehensive metabolic panel  2. Hyperlipidemia, unspecified hyperlipidemia type Encouraged heart healthy diet, increase exercise, avoid trans fats, consider a krill oil cap daily - TSH - CBC with Differential/Platelet - Lipid panel - Comprehensive metabolic panel  3. Hot flashes Inc prozac to 20 mg daily and f/u if no improvement  - FLUoxetine (PROZAC) 20 MG tablet; Take 1 tablet (20 mg total) by mouth daily.  Dispense: 90 tablet; Refill: 3  4. Anxiety Inc prozac 20 mg daily  - FLUoxetine (PROZAC) 20 MG tablet; Take 1 tablet (20 mg total) by mouth daily.  Dispense: 90 tablet; Refill: 3  5. Vitamin D deficiency Check labs   6. Estrogen deficiency   - DG Bone Density; Future  7. Vitamin B12 deficiency   - Vitamin B12

## 2020-06-02 NOTE — Assessment & Plan Note (Signed)
Inc prozac 20 mg qd  F/u prn

## 2020-06-02 NOTE — Assessment & Plan Note (Signed)
Slightly worse due to hot flashes Inc prozac to 20 mg daily

## 2020-06-08 ENCOUNTER — Other Ambulatory Visit: Payer: Self-pay | Admitting: Family Medicine

## 2020-06-15 ENCOUNTER — Other Ambulatory Visit: Payer: Self-pay | Admitting: Family Medicine

## 2020-06-15 DIAGNOSIS — Z1231 Encounter for screening mammogram for malignant neoplasm of breast: Secondary | ICD-10-CM

## 2020-06-30 ENCOUNTER — Other Ambulatory Visit: Payer: Self-pay | Admitting: Family Medicine

## 2020-08-04 ENCOUNTER — Other Ambulatory Visit: Payer: Self-pay

## 2020-08-04 ENCOUNTER — Ambulatory Visit
Admission: RE | Admit: 2020-08-04 | Discharge: 2020-08-04 | Disposition: A | Discharge status: Home / Self Care | Payer: No Typology Code available for payment source | Source: Ambulatory Visit | Attending: Family Medicine | Admitting: Family Medicine

## 2020-08-04 DIAGNOSIS — Z1231 Encounter for screening mammogram for malignant neoplasm of breast: Secondary | ICD-10-CM

## 2020-11-14 ENCOUNTER — Other Ambulatory Visit: Payer: Self-pay

## 2020-11-14 ENCOUNTER — Ambulatory Visit
Admission: RE | Admit: 2020-11-14 | Discharge: 2020-11-14 | Disposition: A | Discharge status: Home / Self Care | Payer: No Typology Code available for payment source | Source: Ambulatory Visit | Attending: Family Medicine | Admitting: Family Medicine

## 2020-11-14 DIAGNOSIS — E2839 Other primary ovarian failure: Secondary | ICD-10-CM

## 2020-11-25 ENCOUNTER — Other Ambulatory Visit: Payer: Self-pay | Admitting: Family Medicine

## 2021-03-12 DIAGNOSIS — R490 Dysphonia: Secondary | ICD-10-CM | POA: Insufficient documentation

## 2021-03-12 DIAGNOSIS — R059 Cough, unspecified: Secondary | ICD-10-CM | POA: Insufficient documentation

## 2021-06-03 ENCOUNTER — Other Ambulatory Visit: Payer: Self-pay | Admitting: Family Medicine

## 2021-06-03 DIAGNOSIS — R232 Flushing: Secondary | ICD-10-CM

## 2021-06-03 DIAGNOSIS — F419 Anxiety disorder, unspecified: Secondary | ICD-10-CM

## 2021-06-25 ENCOUNTER — Other Ambulatory Visit: Payer: Self-pay | Admitting: Family Medicine

## 2021-09-27 ENCOUNTER — Other Ambulatory Visit: Payer: Self-pay | Admitting: Family Medicine

## 2021-09-27 DIAGNOSIS — Z1231 Encounter for screening mammogram for malignant neoplasm of breast: Secondary | ICD-10-CM

## 2021-09-28 ENCOUNTER — Encounter: Payer: Self-pay | Admitting: Family Medicine

## 2021-09-28 ENCOUNTER — Ambulatory Visit (INDEPENDENT_AMBULATORY_CARE_PROVIDER_SITE_OTHER): Payer: No Typology Code available for payment source | Admitting: Family Medicine

## 2021-09-28 VITALS — BP 124/82 | HR 81 | Temp 97.7°F | Resp 18 | Ht 65.0 in | Wt 219.2 lb

## 2021-09-28 DIAGNOSIS — Z0000 Encounter for general adult medical examination without abnormal findings: Secondary | ICD-10-CM | POA: Insufficient documentation

## 2021-09-28 DIAGNOSIS — Z0001 Encounter for general adult medical examination with abnormal findings: Secondary | ICD-10-CM | POA: Diagnosis not present

## 2021-09-28 DIAGNOSIS — Z23 Encounter for immunization: Secondary | ICD-10-CM | POA: Diagnosis not present

## 2021-09-28 DIAGNOSIS — E785 Hyperlipidemia, unspecified: Secondary | ICD-10-CM | POA: Diagnosis not present

## 2021-09-28 DIAGNOSIS — R1013 Epigastric pain: Secondary | ICD-10-CM | POA: Diagnosis not present

## 2021-09-28 DIAGNOSIS — F419 Anxiety disorder, unspecified: Secondary | ICD-10-CM

## 2021-09-28 DIAGNOSIS — R232 Flushing: Secondary | ICD-10-CM

## 2021-09-28 DIAGNOSIS — E559 Vitamin D deficiency, unspecified: Secondary | ICD-10-CM

## 2021-09-28 MED ORDER — OMEPRAZOLE 20 MG PO CPDR: 20.0000 mg | DELAYED_RELEASE_CAPSULE | Freq: Every day | ORAL | 3 refills | Status: DC

## 2021-09-28 MED ORDER — ROSUVASTATIN CALCIUM 10 MG PO TABS: 10.0000 mg | ORAL_TABLET | Freq: Every day | ORAL | 3 refills | Status: AC

## 2021-09-28 MED ORDER — FLUOXETINE HCL 20 MG PO TABS: 20.0000 mg | ORAL_TABLET | Freq: Every day | ORAL | 3 refills | Status: AC

## 2021-09-28 NOTE — Patient Instructions (Signed)
Preventive Care 65 Years and Older, Female Preventive care refers to lifestyle choices and visits with your health care provider that can promote health and wellness. Preventive care visits are also called wellness exams. What can I expect for my preventive care visit? Counseling Your health care provider may ask you questions about your: Medical history, including: Past medical problems. Family medical history. Pregnancy and menstrual history. History of falls. Current health, including: Memory and ability to understand (cognition). Emotional well-being. Home life and relationship well-being. Sexual activity and sexual health. Lifestyle, including: Alcohol, nicotine or tobacco, and drug use. Access to firearms. Diet, exercise, and sleep habits. Work and work environment. Sunscreen use. Safety issues such as seatbelt and bike helmet use. Physical exam Your health care provider will check your: Height and weight. These may be used to calculate your BMI (body mass index). BMI is a measurement that tells if you are at a healthy weight. Waist circumference. This measures the distance around your waistline. This measurement also tells if you are at a healthy weight and may help predict your risk of certain diseases, such as type 2 diabetes and high blood pressure. Heart rate and blood pressure. Body temperature. Skin for abnormal spots. What immunizations do I need?  Vaccines are usually given at various ages, according to a schedule. Your health care provider will recommend vaccines for you based on your age, medical history, and lifestyle or other factors, such as travel or where you work. What tests do I need? Screening Your health care provider may recommend screening tests for certain conditions. This may include: Lipid and cholesterol levels. Hepatitis C test. Hepatitis B test. HIV (human immunodeficiency virus) test. STI (sexually transmitted infection) testing, if you are at  risk. Lung cancer screening. Colorectal cancer screening. Diabetes screening. This is done by checking your blood sugar (glucose) after you have not eaten for a while (fasting). Mammogram. Talk with your health care provider about how often you should have regular mammograms. BRCA-related cancer screening. This may be done if you have a family history of breast, ovarian, tubal, or peritoneal cancers. Bone density scan. This is done to screen for osteoporosis. Talk with your health care provider about your test results, treatment options, and if necessary, the need for more tests. Follow these instructions at home: Eating and drinking  Eat a diet that includes fresh fruits and vegetables, whole grains, lean protein, and low-fat dairy products. Limit your intake of foods with high amounts of sugar, saturated fats, and salt. Take vitamin and mineral supplements as recommended by your health care provider. Do not drink alcohol if your health care provider tells you not to drink. If you drink alcohol: Limit how much you have to 0-1 drink a day. Know how much alcohol is in your drink. In the [place] one drink equals one 12 oz bottle of beer (355 mL), one 5 oz glass of wine (148 mL), or one 1 oz glass of hard liquor (44 mL). Lifestyle Brush your teeth every morning and night with fluoride toothpaste. Floss one time each day. Exercise for at least 30 minutes 5 or more days each week. Do not use any products that contain nicotine or tobacco. These products include cigarettes, chewing tobacco, and vaping devices, such as e-cigarettes. If you need help quitting, ask your health care provider. Do not use drugs. If you are sexually active, practice safe sex. Use a condom or other form of protection in order to prevent STIs. Take aspirin only as told by   your health care provider. Make sure that you understand how much to take and what form to take. Work with your health care provider to find out whether it  is safe and beneficial for you to take aspirin daily. Ask your health care provider if you need to take a cholesterol-lowering medicine (statin). Find healthy ways to manage stress, such as: Meditation, yoga, or listening to music. Journaling. Talking to a trusted person. Spending time with friends and family. Minimize exposure to UV radiation to reduce your risk of skin cancer. Safety Always wear your seat belt while driving or riding in a vehicle. Do not drive: If you have been drinking alcohol. Do not ride with someone who has been drinking. When you are tired or distracted. While texting. If you have been using any mind-altering substances or drugs. Wear a helmet and other protective equipment during sports activities. If you have firearms in your house, make sure you follow all gun safety procedures. What's next? Visit your health care provider once a year for an annual wellness visit. Ask your health care provider how often you should have your eyes and teeth checked. Stay up to date on all vaccines. This information is not intended to replace advice given to you by your health care provider. Make sure you discuss any questions you have with your health care provider. Document Revised: 10/18/2020 Document Reviewed: 10/18/2020 Elsevier Patient Education  2023 Elsevier Inc.  

## 2021-09-28 NOTE — Assessment & Plan Note (Signed)
Tolerating statin, encouraged heart healthy diet, avoid trans fats, minimize simple carbs and saturated fats. Increase exercise as tolerated 

## 2021-09-28 NOTE — Assessment & Plan Note (Signed)
ghm utd Check labs  See avs Pneum 20 and tdap was given

## 2021-09-28 NOTE — Progress Notes (Signed)
Subjective:   By signing my name below, I, Kelli Gould, attest that this documentation has been prepared under the direction and in the presence of Kelli Schultz, DO. 09/28/2021   Patient ID: Kelli Gould, female    DOB: 08/25/1955, 66 y.o.   MRN: [id number]  Chief Complaint  Patient presents with   Annual Exam    Pt states fasting    HPI Patient is in today for a comprehensive physical exam.  She reports she has not been taking Vitamin D pills and wondering if it is causing fatigue.  She reports her hot flashes are resolved. Adds that in the past few weeks, she experiences nausea, vomiting and upper abdominal pain after eating. She denies heart burn and frequent burping.   She is interested in the Healthy Weight and Wellness program but it was too expensive. She is trying to reduce and cut out sugar intake.   She  denies fever, hearing loss, ear pain,congestion, sinus pain, sore throat, eye pain, chest pain, palpitations, cough, shortness of breath, wheezing, diarrhea, constipation, blood in stool, dysuria,frequency, hematuria and headaches.   She is requesting for a refill on 20 mg Prozac and 10 mg Crestor.   She will receive the pneumonia and tetanus vaccines today. She is UTD on shingles vaccine. She has 3 Covid-19 vaccines at this time.   She is UTD on dental and vision.  No changes in family medical history. No recent surgeries.  Past Medical History:  Diagnosis Date   Anxiety    Family history of breast cancer    Family history of ovarian cancer     Past Surgical History:  Procedure Laterality Date   APPENDECTOMY  1995   BACK SURGERY     x's 3 days--Fusion on 2 diferent disk   CHOLECYSTECTOMY  1994   SPINE SURGERY  2004,05,09    microdiscectomy, fusion  L3-4, L5-6-- Dr Shelle Iron   VAGINAL HYSTERECTOMY  1994   TAH-- endometriosis    Family History  Problem Relation Age of Onset   Heart disease Mother 5       Lived until 11   Hypertension Mother 36    Dementia Mother    Crohn's disease Father    Congenital heart disease Father    COPD Father    Heart disease Maternal Aunt    Ovarian cancer Maternal Grandmother 28   Heart disease Maternal Grandfather    Breast cancer Other        MGMs sister dx under 73   Breast cancer Other        MFMs sister dx under 50   Lung cancer Other        2 MGMs brothers with lung cancer   Skin cancer Other        MGMs brother with skin cancer    Social History   Socioeconomic History   Marital status: Married    Spouse name: Not on file   Number of children: 2   Years of education: Not on file   Highest education level: Not on file  Occupational History   Not on file  Tobacco Use   Smoking status: Never   Smokeless tobacco: Never  Vaping Use   Vaping Use: Never used  Substance and Sexual Activity   Alcohol use: No   Drug use: No   Sexual activity: Yes    Partners: Male  Other Topics Concern   Not on file  Social History Narrative   Exercise--walk---not  enough per pt   Lives with husband in a one story home.  Has 2 children.  Works for [employer].     Social Determinants of Health   Financial Resource Strain: Not on file  Food Insecurity: Not on file  Transportation Needs: Not on file  Physical Activity: Not on file  Stress: Not on file  Social Connections: Not on file  Intimate Partner Violence: Not on file    Outpatient Medications Prior to Visit  Medication Sig Dispense Refill   Vitamin D, Ergocalciferol, (DRISDOL) 1.25 MG (50000 UNIT) CAPS capsule TAKE 1 CAPSULE BY MOUTH ONE TIME PER WEEK 12 capsule 0   FLUoxetine (PROZAC) 20 MG tablet TAKE 1 TABLET BY MOUTH EVERY DAY 90 tablet 1   rosuvastatin (CRESTOR) 10 MG tablet TAKE 1 TABLET BY MOUTH EVERY DAY 90 tablet 1   traMADol (ULTRAM) 50 MG tablet Take 1 tablet (50 mg total) by mouth every 6 (six) hours as needed. (Patient not taking: Reported on 09/28/2021) 30 tablet 0   No facility-administered medications prior to visit.     No Known Allergies  Review of Systems  Constitutional:  Positive for malaise/fatigue. Negative for fever.  HENT:  Negative for congestion, ear pain, hearing loss, sinus pain and sore throat.   Eyes:  Negative for blurred vision and pain.  Respiratory:  Negative for cough, sputum production, shortness of breath and wheezing.   Cardiovascular:  Negative for chest pain and palpitations.  Gastrointestinal:  Positive for abdominal pain, nausea and vomiting. Negative for blood in stool, constipation and diarrhea.  Genitourinary:  Negative for dysuria, frequency, hematuria and urgency.  Musculoskeletal:  Negative for back pain, falls and myalgias.  Neurological:  Negative for dizziness, sensory change, loss of consciousness, weakness and headaches.  Endo/Heme/Allergies:  Negative for environmental allergies. Does not bruise/bleed easily.  Psychiatric/Behavioral:  Negative for depression and suicidal ideas. The patient is not nervous/anxious and does not have insomnia.       Objective:    Physical Exam Constitutional:      General: She is not in acute distress.    Appearance: Normal appearance. She is not ill-appearing.  HENT:     Head: Normocephalic and atraumatic.     Right Ear: Tympanic membrane, ear canal and external ear normal.     Left Ear: Tympanic membrane, ear canal and external ear normal.     Mouth/Throat:     Mouth: Mucous membranes are moist.  Eyes:     Extraocular Movements: Extraocular movements intact.     Pupils: Pupils are equal, round, and reactive to light.  Cardiovascular:     Rate and Rhythm: Normal rate and regular rhythm.     Pulses: Normal pulses.     Heart sounds: Normal heart sounds. No murmur heard.   No gallop.  Pulmonary:     Effort: Pulmonary effort is normal. No respiratory distress.     Breath sounds: Normal breath sounds. No wheezing, rhonchi or rales.  Abdominal:     General: Bowel sounds are normal. There is no distension.     Palpations:  Abdomen is soft. There is no mass.     Tenderness: There is no abdominal tenderness. There is no guarding or rebound.     Hernia: No hernia is present.  Musculoskeletal:     Cervical back: Normal range of motion and neck supple.  Lymphadenopathy:     Cervical: No cervical adenopathy.  Skin:    General: Skin is warm and dry.  Neurological:  Mental Status: She is alert and oriented to person, place, and time.  Psychiatric:        Behavior: Behavior normal.    BP 124/82 (BP Location: Left Arm, Patient Position: Sitting, Cuff Size: Large)   Pulse 81   Temp 97.7 F (36.5 C) (Oral)   Resp 18   Ht 5\' 5"  (1.651 m)   Wt 219 lb 3.2 oz (99.4 kg)   SpO2 95%   BMI 36.48 kg/m  Wt Readings from Last 3 Encounters:  09/28/21 219 lb 3.2 oz (99.4 kg)  06/02/20 217 lb (98.4 kg)  09/16/19 225 lb (102.1 kg)    Diabetic Foot Exam - Simple   No data filed    Lab Results  Component Value Date   WBC 8.1 06/02/2020   HGB 13.7 06/02/2020   HCT 41.5 06/02/2020   PLT 246.0 06/02/2020   GLUCOSE 99 06/02/2020   CHOL 127 06/02/2020   TRIG 114.0 06/02/2020   HDL 43.80 06/02/2020   LDLDIRECT 138.8 10/16/2012   LDLCALC 60 06/02/2020   ALT 14 06/02/2020   AST 18 06/02/2020   NA 142 06/02/2020   K 4.2 06/02/2020   CL 105 06/02/2020   CREATININE 0.80 06/02/2020   BUN 16 06/02/2020   CO2 32 06/02/2020   TSH 2.41 06/02/2020   INR 0.9 05/08/2007   HGBA1C 5.7 04/18/2016    Lab Results  Component Value Date   TSH 2.41 06/02/2020   Lab Results  Component Value Date   WBC 8.1 06/02/2020   HGB 13.7 06/02/2020   HCT 41.5 06/02/2020   MCV 97.6 06/02/2020   PLT 246.0 06/02/2020   Lab Results  Component Value Date   NA 142 06/02/2020   K 4.2 06/02/2020   CO2 32 06/02/2020   GLUCOSE 99 06/02/2020   BUN 16 06/02/2020   CREATININE 0.80 06/02/2020   BILITOT 0.5 06/02/2020   ALKPHOS 117 06/02/2020   AST 18 06/02/2020   ALT 14 06/02/2020   PROT 7.0 06/02/2020   ALBUMIN 4.3 06/02/2020    CALCIUM 10.0 06/02/2020   ANIONGAP 8 04/04/2016   GFR 78.03 06/02/2020   Lab Results  Component Value Date   CHOL 127 06/02/2020   Lab Results  Component Value Date   HDL 43.80 06/02/2020   Lab Results  Component Value Date   LDLCALC 60 06/02/2020   Lab Results  Component Value Date   TRIG 114.0 06/02/2020   Lab Results  Component Value Date   CHOLHDL 3 06/02/2020   Lab Results  Component Value Date   HGBA1C 5.7 04/18/2016       Mammogram- Last checked on 08/04/2020. Results were normal. Appointment on 05/31 Dexa- Last checked on 11/14/2020. Patient was considered normal. Repeat in 2 years Colonoscopy- Last completed on 04/23/2016. The colon was normal. Repeat in 10 years.  Assessment & Plan:   Problem List Items Addressed This Visit       Unprioritized   Vitamin D deficiency   Relevant Orders   VITAMIN D 25 Hydroxy (Vit-D Deficiency, Fractures)   Hyperlipidemia   Relevant Medications   rosuvastatin (CRESTOR) 10 MG tablet   Other Relevant Orders   CBC with Differential/Platelet   Comprehensive metabolic panel   Lipid panel   TSH   Hot flashes   Relevant Medications   FLUoxetine (PROZAC) 20 MG tablet   rosuvastatin (CRESTOR) 10 MG tablet   Anxiety   Relevant Medications   FLUoxetine (PROZAC) 20 MG tablet   Other Visit Diagnoses  Preventative health care    -  Primary   Relevant Orders   CBC with Differential/Platelet   Comprehensive metabolic panel   Lipid panel   TSH   Need for pneumococcal 20-valent conjugate vaccination       Relevant Orders   Pneumococcal conjugate vaccine 20-valent (Prevnar 20) (Completed)   Dyspepsia       Relevant Medications   omeprazole (PRILOSEC) 20 MG capsule   Other Relevant Orders   Ambulatory referral to Gastroenterology   Need for tetanus booster       Relevant Orders   Tdap vaccine greater than or equal to 7yo IM (Completed)        Meds ordered this encounter  Medications   FLUoxetine (PROZAC)  20 MG tablet    Sig: Take 1 tablet (20 mg total) by mouth daily.    Dispense:  90 tablet    Refill:  3   rosuvastatin (CRESTOR) 10 MG tablet    Sig: Take 1 tablet (10 mg total) by mouth daily.    Dispense:  90 tablet    Refill:  3   omeprazole (PRILOSEC) 20 MG capsule    Sig: Take 1 capsule (20 mg total) by mouth daily.    Dispense:  30 capsule    Refill:  3    I,Kelli Gould,acting as a scribe for [employer], DO.,have documented all relevant documentation on the behalf of Kelli Schultz, DO,as directed by  Kelli Schultz, DO while in the presence of Kelli Schultz, DO.   I, Kelli Schultz, DO., personally preformed the services described in this documentation.  All medical record entries made by the scribe were at my direction and in my presence.  I have reviewed the chart and discharge instructions (if applicable) and agree that the record reflects my personal performance and is accurate and complete. 09/28/2021

## 2021-09-29 LAB — COMPREHENSIVE METABOLIC PANEL
AG Ratio: 1.8 (calc) (ref 1.0–2.5)
ALT: 14 U/L (ref 6–29)
AST: 21 U/L (ref 10–35)
Albumin: 4.4 g/dL (ref 3.6–5.1)
Alkaline phosphatase (APISO): 115 U/L (ref 37–153)
BUN: 20 mg/dL (ref 7–25)
CO2: 23 mmol/L (ref 20–32)
Calcium: 9.9 mg/dL (ref 8.6–10.4)
Chloride: 107 mmol/L (ref 98–110)
Creat: 0.79 mg/dL (ref 0.50–1.05)
Globulin: 2.4 g/dL (calc) (ref 1.9–3.7)
Glucose, Bld: 88 mg/dL (ref 65–99)
Potassium: 4.2 mmol/L (ref 3.5–5.3)
Sodium: 142 mmol/L (ref 135–146)
Total Bilirubin: 0.6 mg/dL (ref 0.2–1.2)
Total Protein: 6.8 g/dL (ref 6.1–8.1)

## 2021-09-29 LAB — CBC WITH DIFFERENTIAL/PLATELET
Absolute Monocytes: 507 cells/uL (ref 200–950)
Basophils Absolute: 39 cells/uL (ref 0–200)
Basophils Relative: 0.5 %
Eosinophils Absolute: 70 cells/uL (ref 15–500)
Eosinophils Relative: 0.9 %
HCT: 42.3 % (ref 35.0–45.0)
Hemoglobin: 14.4 g/dL (ref 11.7–15.5)
Lymphs Abs: 1435 cells/uL (ref 850–3900)
MCH: 32.7 pg (ref 27.0–33.0)
MCHC: 34 g/dL (ref 32.0–36.0)
MCV: 96.1 fL (ref 80.0–100.0)
MPV: 10.1 fL (ref 7.5–12.5)
Monocytes Relative: 6.5 %
Neutro Abs: 5749 cells/uL (ref 1500–7800)
Neutrophils Relative %: 73.7 %
Platelets: 238 10*3/uL (ref 140–400)
RBC: 4.4 10*6/uL (ref 3.80–5.10)
RDW: 11.8 % (ref 11.0–15.0)
Total Lymphocyte: 18.4 %
WBC: 7.8 10*3/uL (ref 3.8–10.8)

## 2021-09-29 LAB — TSH: TSH: 1.05 mIU/L (ref 0.40–4.50)

## 2021-09-29 LAB — VITAMIN D 25 HYDROXY (VIT D DEFICIENCY, FRACTURES): Vit D, 25-Hydroxy: 43 ng/mL (ref 30–100)

## 2021-09-29 LAB — LIPID PANEL
Cholesterol: 129 mg/dL (ref <200)
HDL: 49 mg/dL — ABNORMAL LOW (ref >=50)
LDL Cholesterol (Calc): 64 mg/dL (calc)
Non-HDL Cholesterol (Calc): 80 mg/dL (calc) (ref <130)
Total CHOL/HDL Ratio: 2.6 (calc) (ref <5.0)
Triglycerides: 80 mg/dL (ref <150)

## 2021-10-03 ENCOUNTER — Ambulatory Visit
Admission: RE | Admit: 2021-10-03 | Discharge: 2021-10-03 | Disposition: A | Discharge status: Home / Self Care | Payer: No Typology Code available for payment source | Source: Ambulatory Visit | Attending: Family Medicine | Admitting: Family Medicine

## 2021-10-03 DIAGNOSIS — Z1231 Encounter for screening mammogram for malignant neoplasm of breast: Secondary | ICD-10-CM

## 2021-11-07 DIAGNOSIS — M79621 Pain in right upper arm: Secondary | ICD-10-CM | POA: Insufficient documentation

## 2021-12-25 ENCOUNTER — Other Ambulatory Visit: Payer: Self-pay | Admitting: Family Medicine

## 2021-12-25 DIAGNOSIS — R1013 Epigastric pain: Secondary | ICD-10-CM

## 2022-05-31 ENCOUNTER — Ambulatory Visit
Admission: RE | Admit: 2022-05-31 | Discharge: 2022-05-31 | Disposition: A | Discharge status: Home / Self Care | Payer: No Typology Code available for payment source | Source: Ambulatory Visit | Attending: Internal Medicine | Admitting: Internal Medicine

## 2022-05-31 ENCOUNTER — Other Ambulatory Visit: Payer: Self-pay | Admitting: Internal Medicine

## 2022-05-31 DIAGNOSIS — R10A2 Flank pain, left side: Secondary | ICD-10-CM

## 2022-05-31 DIAGNOSIS — R109 Unspecified abdominal pain: Secondary | ICD-10-CM

## 2022-06-10 ENCOUNTER — Other Ambulatory Visit: Payer: Self-pay

## 2022-06-10 ENCOUNTER — Emergency Department (HOSPITAL_COMMUNITY): Payer: No Typology Code available for payment source

## 2022-06-10 ENCOUNTER — Emergency Department (HOSPITAL_COMMUNITY)
Admission: EM | Admit: 2022-06-10 | Discharge: 2022-06-10 | Disposition: A | Discharge status: Home / Self Care | Payer: No Typology Code available for payment source | Attending: Emergency Medicine | Admitting: Emergency Medicine

## 2022-06-10 DIAGNOSIS — R200 Anesthesia of skin: Secondary | ICD-10-CM | POA: Diagnosis not present

## 2022-06-10 DIAGNOSIS — R079 Chest pain, unspecified: Secondary | ICD-10-CM

## 2022-06-10 DIAGNOSIS — R202 Paresthesia of skin: Secondary | ICD-10-CM | POA: Diagnosis present

## 2022-06-10 DIAGNOSIS — R0789 Other chest pain: Secondary | ICD-10-CM | POA: Diagnosis not present

## 2022-06-10 LAB — URINALYSIS, ROUTINE W REFLEX MICROSCOPIC
Bacteria, UA: NONE SEEN
Bilirubin Urine: NEGATIVE
Glucose, UA: NEGATIVE mg/dL
Hgb urine dipstick: NEGATIVE
Ketones, ur: NEGATIVE mg/dL
Nitrite: NEGATIVE
Protein, ur: NEGATIVE mg/dL
Specific Gravity, Urine: 1.034 — ABNORMAL HIGH (ref 1.005–1.030)
pH: 6 (ref 5.0–8.0)

## 2022-06-10 LAB — TROPONIN I (HIGH SENSITIVITY)
Troponin I (High Sensitivity): 5 ng/L (ref <18)
Troponin I (High Sensitivity): 5 ng/L (ref <18)

## 2022-06-10 LAB — CBC
HCT: 42.5 % (ref 36.0–46.0)
Hemoglobin: 14.2 g/dL (ref 12.0–15.0)
MCH: 33.3 pg (ref 26.0–34.0)
MCHC: 33.4 g/dL (ref 30.0–36.0)
MCV: 99.8 fL (ref 80.0–100.0)
Platelets: 230 10*3/uL (ref 150–400)
RBC: 4.26 MIL/uL (ref 3.87–5.11)
RDW: 12.5 % (ref 11.5–15.5)
WBC: 8.2 10*3/uL (ref 4.0–10.5)
nRBC: 0 % (ref 0.0–0.2)

## 2022-06-10 LAB — I-STAT CHEM 8, ED
BUN: 20 mg/dL (ref 8–23)
Calcium, Ion: 1.23 mmol/L (ref 1.15–1.40)
Chloride: 105 mmol/L (ref 98–111)
Creatinine, Ser: 0.7 mg/dL (ref 0.44–1.00)
Glucose, Bld: 173 mg/dL — ABNORMAL HIGH (ref 70–99)
HCT: 43 % (ref 36.0–46.0)
Hemoglobin: 14.6 g/dL (ref 12.0–15.0)
Potassium: 4.7 mmol/L (ref 3.5–5.1)
Sodium: 141 mmol/L (ref 135–145)
TCO2: 26 mmol/L (ref 22–32)

## 2022-06-10 LAB — DIFFERENTIAL
Abs Immature Granulocytes: 0.01 10*3/uL (ref 0.00–0.07)
Basophils Absolute: 0 10*3/uL (ref 0.0–0.1)
Basophils Relative: 1 %
Eosinophils Absolute: 0.1 10*3/uL (ref 0.0–0.5)
Eosinophils Relative: 1 %
Immature Granulocytes: 0 %
Lymphocytes Relative: 22 %
Lymphs Abs: 1.8 10*3/uL (ref 0.7–4.0)
Monocytes Absolute: 0.4 10*3/uL (ref 0.1–1.0)
Monocytes Relative: 5 %
Neutro Abs: 5.8 10*3/uL (ref 1.7–7.7)
Neutrophils Relative %: 71 %

## 2022-06-10 LAB — RAPID URINE DRUG SCREEN, HOSP PERFORMED
Amphetamines: NOT DETECTED
Barbiturates: NOT DETECTED
Benzodiazepines: NOT DETECTED
Cocaine: NOT DETECTED
Opiates: NOT DETECTED
Tetrahydrocannabinol: NOT DETECTED

## 2022-06-10 LAB — COMPREHENSIVE METABOLIC PANEL
ALT: 20 U/L (ref 0–44)
AST: 30 U/L (ref 15–41)
Albumin: 4.1 g/dL (ref 3.5–5.0)
Alkaline Phosphatase: 119 U/L (ref 38–126)
Anion gap: 7 (ref 5–15)
BUN: 15 mg/dL (ref 8–23)
CO2: 25 mmol/L (ref 22–32)
Calcium: 9.7 mg/dL (ref 8.9–10.3)
Chloride: 105 mmol/L (ref 98–111)
Creatinine, Ser: 0.79 mg/dL (ref 0.44–1.00)
GFR, Estimated: >60 mL/min (ref >60)
Glucose, Bld: 172 mg/dL — ABNORMAL HIGH (ref 70–99)
Potassium: 3.9 mmol/L (ref 3.5–5.1)
Sodium: 137 mmol/L (ref 135–145)
Total Bilirubin: 0.4 mg/dL (ref 0.3–1.2)
Total Protein: 7 g/dL (ref 6.5–8.1)

## 2022-06-10 LAB — CBG MONITORING, ED: Glucose-Capillary: 169 mg/dL — ABNORMAL HIGH (ref 70–99)

## 2022-06-10 LAB — PROTIME-INR
INR: 1 (ref 0.8–1.2)
Prothrombin Time: 13.3 seconds (ref 11.4–15.2)

## 2022-06-10 LAB — APTT: aPTT: 28 seconds (ref 24–36)

## 2022-06-10 LAB — ETHANOL: Alcohol, Ethyl (B): <10 mg/dL (ref <10)

## 2022-06-10 MED ORDER — NITROGLYCERIN 2 % TD OINT
1.0000 [in_us] | TOPICAL_OINTMENT | Freq: Once | TRANSDERMAL | Status: DC
  Filled 2022-06-10: qty 1

## 2022-06-10 MED ORDER — ASPIRIN 81 MG PO CHEW
324.0000 mg | CHEWABLE_TABLET | Freq: Once | ORAL | Status: AC
  Administered 2022-06-10: 324 mg via ORAL
  Filled 2022-06-10: qty 4

## 2022-06-10 MED ORDER — IOHEXOL 350 MG/ML SOLN
75.0000 mL | Freq: Once | INTRAVENOUS | Status: AC | PRN
  Administered 2022-06-10: 75 mL via INTRAVENOUS

## 2022-06-10 NOTE — ED Notes (Signed)
Pt to CT at this time.

## 2022-06-10 NOTE — ED Provider Triage Note (Cosign Needed Addendum)
Emergency Medicine Provider Triage Evaluation Note  Kelli Gould , a 67 y.o. female  was evaluated in triage.  Pt complains of left facial and left arm tingling that onset at 1 PM today followed by the onset of anterior central chest pressure and nausea.  She denies visual changes, gait changes, upper or lower extremity weakness, facial weakness, fever, chills, shortness of breath, or other symptoms at this time.  She states that she has never had the symptoms before and has no history of TIA/CVA.Marland Kitchen  Chest pain is in the center of her chest, described as pressure, nonradiating, not severe.  She also denies a history of MI/CAD.  Review of Systems  Positive: See HPI Negative: See HPI  Physical Exam  BP (!) 190/79   Pulse 80   Temp 97.6 F (36.4 C)   Resp 18   SpO2 95%  Gen:   Awake, no distress   Resp:  Normal effort  MSK:   Moves extremities without difficulty  Other:  Alert and oriented, 5/5 strength bilateral upper and lower extremities, normal speech, subjective diminished sensation to left forehead and cheek, normal sensation to bilateral upper and lower extremities  Medical Decision Making  Medically screening exam initiated at 2:15 PM.  Appropriate orders placed.  JARRETT CHICOINE was informed that the remainder of the evaluation will be completed by another provider, this initial triage assessment does not replace that evaluation, and the importance of remaining in the ED until their evaluation is complete.  Code stroke activated.   Suzzette Righter, PA-C 06/10/22 1417    Suzzette Righter, PA-C 06/10/22 1418

## 2022-06-10 NOTE — ED Triage Notes (Addendum)
EDP at pt side at this time.   Pt presents with sudden onset of L face and arm tingling. Pt also reports feeling stiffness to L side of neck and a perceived weakness to L eye. No focal neuro symptoms noted on exam. Pt does report some CP that started at the same time.   LKW 1300

## 2022-06-10 NOTE — Discharge Instructions (Signed)
Keep your appointment with your doctor for Thursday.  Have him refer you to a cardiologist

## 2022-06-10 NOTE — ED Provider Notes (Signed)
[place] EMERGENCY DEPARTMENT AT [facility] Provider Note   CSN: [id number] Arrival date & time: 06/10/22  1406  An emergency department physician performed an initial assessment on this suspected stroke patient at 7.  History  Chief Complaint  Patient presents with   Code Stroke    Kelli Gould is a 67 y.o. female.  HPI Patient presents for multiple symptoms.  Medical history includes chronic back pain, anxiety, GERD, HLD.  She has had recent intermittent left lateral chest wall pain.  She was seen by primary care doctor who did an EKG and an ultrasound of her kidneys.  2 days ago, she had an episode of left facial tingling.  Her husband reports that she seems pale at the time.  Symptoms resolved.  Today, she woke up in her normal state of health.  She works from home.  At around 1 PM, she had recurrence of left facial tingling that extended into her left arm.  EMS was called and patient arrives as a code stroke.  Husband reports that he did not notice any facial asymmetry or dysarthria.  Patient denies any areas of weakness.  She did not have any trouble ambulating.  On arrival in the ED, she endorses some distal left upper extremity tingling sensation.  She has some stiffness in her back and left side of neck.  She denies any other current symptoms.  Patient has no known cardiac history.  She has no first-degree relatives with early CAD.  She does not smoke.    Home Medications Prior to Admission medications   Medication Sig Start Date End Date Taking? Authorizing Provider  FLUoxetine (PROZAC) 20 MG tablet Take 1 tablet (20 mg total) by mouth daily. 09/28/21   Donato Schultz, DO  omeprazole (PRILOSEC) 20 MG capsule Take 1 capsule (20 mg total) by mouth daily. 12/25/21   Seabron Spates R, DO  rosuvastatin (CRESTOR) 10 MG tablet Take 1 tablet (10 mg total) by mouth daily. 09/28/21   Donato Schultz, DO  Vitamin D, Ergocalciferol, (DRISDOL) 1.25 MG (50000  UNIT) CAPS capsule TAKE 1 CAPSULE BY MOUTH ONE TIME PER WEEK 06/08/20   Donato Schultz, DO      Allergies    Patient has no known allergies.    Review of Systems   Review of Systems  Cardiovascular:  Positive for chest pain.  Gastrointestinal:  Positive for nausea.  Neurological:  Positive for numbness.  All other systems reviewed and are negative.   Physical Exam Updated Vital Signs BP (!) 172/80   Pulse 80   Temp 97.6 F (36.4 C)   Resp 18   Ht 5\' 4"  (1.626 m)   Wt 87.5 kg   SpO2 95%   BMI 33.13 kg/m  Physical Exam Vitals and nursing note reviewed.  Constitutional:      General: She is not in acute distress.    Appearance: Normal appearance. She is well-developed. She is not ill-appearing, toxic-appearing or diaphoretic.  HENT:     Head: Normocephalic and atraumatic.     Right Ear: External ear normal.     Left Ear: External ear normal.     Nose: Nose normal.     Mouth/Throat:     Mouth: Mucous membranes are moist.  Eyes:     Extraocular Movements: Extraocular movements intact.     Conjunctiva/sclera: Conjunctivae normal.  Cardiovascular:     Rate and Rhythm: Normal rate and regular rhythm.  Heart sounds: No murmur heard. Pulmonary:     Effort: Pulmonary effort is normal. No respiratory distress.     Breath sounds: Normal breath sounds. No wheezing or rales.  Abdominal:     General: There is no distension.     Palpations: Abdomen is soft.     Tenderness: There is no abdominal tenderness.  Musculoskeletal:        General: No swelling. Normal range of motion.     Cervical back: Normal range of motion and neck supple.     Right lower leg: No edema.     Left lower leg: No edema.  Skin:    General: Skin is warm and dry.     Capillary Refill: Capillary refill takes less than 2 seconds.     Coloration: Skin is not jaundiced or pale.  Neurological:     General: No focal deficit present.     Mental Status: She is alert and oriented to person, place, and  time.     Cranial Nerves: No cranial nerve deficit.     Sensory: No sensory deficit.     Motor: No weakness.     Coordination: Coordination normal.  Psychiatric:        Mood and Affect: Mood normal.        Behavior: Behavior normal.        Thought Content: Thought content normal.        Judgment: Judgment normal.     ED Results / Procedures / Treatments   Labs (all labs ordered are listed, but only abnormal results are displayed) Labs Reviewed  COMPREHENSIVE METABOLIC PANEL - Abnormal; Notable for the following components:      Result Value   Glucose, Bld 172 (*)    All other components within normal limits  I-STAT CHEM 8, ED - Abnormal; Notable for the following components:   Glucose, Bld 173 (*)    All other components within normal limits  CBG MONITORING, ED - Abnormal; Notable for the following components:   Glucose-Capillary 169 (*)    All other components within normal limits  ETHANOL  PROTIME-INR  APTT  CBC  DIFFERENTIAL  RAPID URINE DRUG SCREEN, HOSP PERFORMED  URINALYSIS, ROUTINE W REFLEX MICROSCOPIC  TROPONIN I (HIGH SENSITIVITY)    EKG EKG Interpretation  Date/Time:  Monday June 10 2022 14:43:24 EST Ventricular Rate:  81 PR Interval:  175 QRS Duration: 81 QT Interval:  391 QTC Calculation: 454 R Axis:   58 Text Interpretation: Sinus rhythm Ventricular premature complex Low voltage, precordial leads Confirmed by Godfrey Pick [phone]) on 06/10/2022 3:31:59 PM  Radiology MR BRAIN WO CONTRAST  Result Date: 06/10/2022 CLINICAL DATA:  Stroke suspected.  Left-sided symptoms. EXAM: MRI HEAD WITHOUT CONTRAST TECHNIQUE: Multiplanar, multiecho pulse sequences of the brain and surrounding structures were obtained without intravenous contrast. COMPARISON:  MRI Head 07/13/17, same day CT head FINDINGS: Brain: No acute infarction, hemorrhage, hydrocephalus, extra-axial collection or mass lesion. Vascular: Normal flow voids. Skull and upper cervical spine: Normal marrow  signal. Sinuses/Orbits: Negative. Other: None. IMPRESSION: No acute intracranial abnormality Electronically Signed   By: Marin Roberts M.D.   On: 06/10/2022 15:38   CT ANGIO HEAD NECK W WO CM (CODE STROKE)  Result Date: 06/10/2022 CLINICAL DATA:  Left-sided weakness EXAM: CT ANGIOGRAPHY HEAD AND NECK TECHNIQUE: Multidetector CT imaging of the head and neck was performed using the standard protocol during bolus administration of intravenous contrast. Multiplanar CT image reconstructions and MIPs were obtained to evaluate the vascular  anatomy. Carotid stenosis measurements (when applicable) are obtained utilizing NASCET criteria, using the distal internal carotid diameter as the denominator. RADIATION DOSE REDUCTION: This exam was performed according to the departmental dose-optimization program which includes automated exposure control, adjustment of the mA and/or kV according to patient size and/or use of iterative reconstruction technique. CONTRAST:  Iodinated contrast was used to improve disease detection. COMPARISON:  CT Head 06/20/17 FINDINGS: CT HEAD FINDINGS See same day CT brain CTA NECK FINDINGS Aortic arch: Standard branching. Imaged portion shows no evidence of aneurysm or dissection. No significant stenosis of the major arch vessel origins. Right carotid system: No evidence of dissection, stenosis (50% or greater), or occlusion. Left carotid system: No evidence of dissection, stenosis (50% or greater), or occlusion. Vertebral arteries: Codominant. No evidence of dissection, stenosis (50% or greater), or occlusion. Skeleton: Negative. Other neck: 1 cm nodule in the thyroid isthmus. Upper chest: Biapical ground-glass opacities and bronchial wall thickening, nonspecific, and possibly infectious or inflammatory. Review of the MIP images confirms the above findings CTA HEAD FINDINGS Anterior circulation: No significant stenosis, proximal occlusion, aneurysm, or vascular malformation. Posterior circulation:  No significant stenosis, proximal occlusion, aneurysm, or vascular malformation. Venous sinuses: As permitted by contrast timing, patent. Anatomic variants: None Review of the MIP images confirms the above findings IMPRESSION: 1. No intracranial large vessel occlusion or significant stenosis. 2. No hemodynamically significant stenosis in the neck. 3. Biapical ground-glass opacities and bronchial wall thickening, nonspecific, likely infectious or inflammatory. Electronically Signed   By: Marin Roberts M.D.   On: 06/10/2022 14:42   CT HEAD CODE STROKE WO CONTRAST  Result Date: 06/10/2022 CLINICAL DATA:  Code stroke.  Left face and arm tingling. EXAM: CT HEAD WITHOUT CONTRAST TECHNIQUE: Contiguous axial images were obtained from the base of the skull through the vertex without intravenous contrast. RADIATION DOSE REDUCTION: This exam was performed according to the departmental dose-optimization program which includes automated exposure control, adjustment of the mA and/or kV according to patient size and/or use of iterative reconstruction technique. COMPARISON:  CT Head 06/20/17 FINDINGS: Brain: No evidence of acute infarction, hemorrhage, hydrocephalus, extra-axial collection or mass lesion/mass effect. Small parafalcine lipoma. Vascular: No hyperdense vessel or unexpected calcification. Skull: Normal. Negative for fracture or focal lesion. Sinuses/Orbits: Trace left mastoid effusion. No middle ear effusion. Paranasal sinuses are clear. Orbits are unremarkable. Other: None. ASPECTS ([place] Stroke Program Early CT Score):10 IMPRESSION: No hemorrhage or CT evidence of an acute infarct. Aspects is 10. Findings were paged to Dr. Rory Percy on 06/10/22 at 2:33 PM via [facility] paging system. Electronically Signed   By: Marin Roberts M.D.   On: 06/10/2022 14:33    Procedures Procedures    Medications Ordered in ED Medications  aspirin chewable tablet 324 mg (has no administration in time range)  nitroGLYCERIN (NITROGLYN) 2  % ointment 1 inch (has no administration in time range)  iohexol (OMNIPAQUE) 350 MG/ML injection 75 mL (75 mLs Intravenous Contrast Given 06/10/22 1437)    ED Course/ Medical Decision Making/ A&P                             Medical Decision Making  This patient presents to the ED for concern of numbness and paresthesias, this involves an extensive number of treatment options, and is a complaint that carries with it a high risk of complications and morbidity.  The differential diagnosis includes CVA, TIA, complex migraine, ACS, cervical radiculopathy   Co morbidities  that complicate the patient evaluation  chronic back pain, anxiety, GERD, HLD   Additional history obtained:  Additional history obtained from patient's husband, EMS External records from outside source obtained and reviewed including EMR   Lab Tests:  I Ordered, and personally interpreted labs.  The pertinent results include: Normal hemoglobin, no leukocytosis, normal electrolytes, troponin is pending at time of signout.   Imaging Studies ordered:  I ordered imaging studies including CT head, CTA head and neck, MRI brain I independently visualized and interpreted imaging which showed no acute findings in the head.  CTA of head and neck did show biapical groundglass opacities. I agree with the radiologist interpretation   Cardiac Monitoring: / EKG:  The patient was maintained on a cardiac monitor.  I personally viewed and interpreted the cardiac monitored which showed an underlying rhythm of: Sinus rhythm   Consultations Obtained:  I requested consultation with the neurologist, Dr. Rory Percy,  and discussed lab and imaging findings as well as pertinent plan - they recommend: No further neurology workup following negative MRI   Problem List / ED Course / Critical interventions / Medication management  Patient is a pleasant 67 year old female presenting for symptoms of left facial and left arm paresthesias.  She  states that she had a similar episode 2 days ago that resolved.  Onset of symptoms today was 1 PM.  Patient was seen in triage and code stroke was initiated.  Initial vital signs are notable for hypertension.  EKG shows no concerning ST segment changes.  Patient was taken directly to CT scanner from triage.  Initial CT scan did not show acute findings.  I spoke with neurologist on-call, Dr. Malen Gauze, who will obtain stat MRI.  If MRI is negative, patient is cleared from neurology perspective.  Neurology did recommend migraine cocktail, however, patient denies any current headache.  Given her recent intermittent symptoms of chest pain in addition to these left-sided paresthesias, patient would benefit from cardiac workup.  Troponin studies were ordered.  ASA and NTG were ordered.  Care patient was signed out to oncoming ED provider. I ordered medication including ASA and NTG for concern of ACS Reevaluation of the patient after these medicines showed that the patient stayed the same I have reviewed the patients home medicines and have made adjustments as needed   Social Determinants of Health:  Lives at home with husband.  Has PCP         Final Clinical Impression(s) / ED Diagnoses Final diagnoses:  Paresthesias    Rx / DC Orders ED Discharge Orders     None         Godfrey Pick, MD 06/10/22 1556

## 2022-06-10 NOTE — Code Documentation (Signed)
Stroke Response Nurse Documentation Code Documentation  Kelli Gould is a 67 y.o. female arriving to [facility]  via [employer] on 06/10/2022 with past medical hx of headaches and HLD. On No antithrombotic. Code stroke was activated by ED Triage.   Patient from home where she was LKW at 1300 and now complaining of left arm pain/tingling, chest pain, stiff neck. Patient reports intermittent chest pain, left arm pain/tingling, and pain in between shoulder blades intermittently over the past few days.   Stroke team at the bedside on patient arrival. Labs drawn and patient cleared for CT by EDP. Patient to CT with team. NIHSS 1, see documentation for details and code stroke times. Patient with left decreased sensation on exam. The following imaging was completed:  CT Head, CTA, and MRI. Patient is not a candidate for IV Thrombolytic due to symptoms too mild to treat per MD. Patient is not a candidate for IR due to no stroke visualized on scans per MD.  Code Stroke Canceled @1520 .   Bedside handoff with ED RN Elexes.    Ralene Cork  Stroke Response RN

## 2022-06-10 NOTE — ED Provider Notes (Signed)
Patient signed out to me by Dr. Doren Custard pending results of troponins as well as her neurology workup.  Spoke with the neurologist who feels that patient symptoms of her complex migraine and not from CVA or TIA.  Patient states that she has had this discomfort now for several weeks.  I have low suspicion for ACS as her troponins were negative.  Patient instructed to follow with her primary care doctor who she is going to see in 2 days.   Lacretia Leigh, MD 06/10/22 [phone]

## 2022-06-10 NOTE — Consult Note (Signed)
Neurology Consultation Reason for Consult: Code stroke for left-sided tingling numbness Referring Physician: Dr. Kathrynn Humble  CC: Left-sided tingling numbness  History is obtained from: Patient chart  HPI: Kelli Gould is a 67 y.o. female past medical history of headaches, hyperlipidemia, presenting to the emergency room for evaluation of sudden onset of left-sided tingling and numbness.  She reports that she was in her usual of health till about 1 PM when she had sudden onset of left arm pain left chest pain and left arm tingling.  Some neurological symptoms happened Saturday with some face tingling on the left but self resolved.  No residual symptoms from Saturday.  Woke up normal this morning.  Working from home on a computer when the symptoms occurred today.  Still has some left-sided pain under her armpit/chest wall and also complains of left fingertips tingling. No strokes in the past.   LKW: 1300 hrs. IV thrombolysis given?: no, symptoms too mild to treat versus complex migraine EVT: Examination not in Premorbid m0  ROS: Full ROS was performed and is negative except as noted in the HPI.  Past Medical History:  Diagnosis Date   Anxiety    Family history of breast cancer    Family history of ovarian cancer      Family History  Problem Relation Age of Onset   Heart disease Mother 60       Lived until 55   Hypertension Mother 28   Dementia Mother    Crohn's disease Father    Congenital heart disease Father    COPD Father    Heart disease Maternal Aunt    Ovarian cancer Maternal Grandmother 49   Heart disease Maternal Grandfather    Breast cancer Other        MGMs sister dx under 21   Breast cancer Other        MFMs sister dx under 16   Lung cancer Other        2 MGMs brothers with lung cancer   Skin cancer Other        MGMs brother with skin cancer     Social History:   reports that she has never smoked. She has never used smokeless tobacco. She reports that she  does not drink alcohol and does not use drugs.  Medications No current facility-administered medications for this encounter.  Current Outpatient Medications:    FLUoxetine (PROZAC) 20 MG tablet, Take 1 tablet (20 mg total) by mouth daily., Disp: 90 tablet, Rfl: 3   omeprazole (PRILOSEC) 20 MG capsule, Take 1 capsule (20 mg total) by mouth daily., Disp: 90 capsule, Rfl: 1   rosuvastatin (CRESTOR) 10 MG tablet, Take 1 tablet (10 mg total) by mouth daily., Disp: 90 tablet, Rfl: 3   Vitamin D, Ergocalciferol, (DRISDOL) 1.25 MG (50000 UNIT) CAPS capsule, TAKE 1 CAPSULE BY MOUTH ONE TIME PER WEEK, Disp: 12 capsule, Rfl: 0   Exam: Current vital signs: BP (!) 190/79   Pulse 80   Temp 97.6 F (36.4 C)   Resp 18   Ht 5\' 4"  (1.626 m)   Wt 87.5 kg   SpO2 95%   BMI 33.13 kg/m  Vital signs in last 24 hours: Temp:  [97.6 F (36.4 C)] 97.6 F (36.4 C) (02/05 1410) Pulse Rate:  [80] 80 (02/05 1410) Resp:  [18] 18 (02/05 1410) BP: (190)/(79) 190/79 (02/05 1410) SpO2:  [95 %] 95 % (02/05 1410) Weight:  [87.5 kg] 87.5 kg (02/05 1416)  GENERAL: Awake, alert  in NAD HEENT: - Normocephalic and atraumatic, dry mm, no LN++, no Thyromegally LUNGS - Clear to auscultation bilaterally with no wheezes CV - S1S2 RRR, no m/r/g, equal pulses bilaterally. ABDOMEN - Soft, nontender, nondistended with normoactive BS Ext: warm, well perfused, intact peripheral pulses, no edema  NEURO:  Mental Status: AA&Ox3  Language: speech is nondysarthric.  Naming, repetition, fluency, and comprehension intact. Cranial Nerves: PERRL EOMI, visual fields full, no facial asymmetry, facial sensation diminished on the left in comparison to the right but more so tingling and numbness, hearing intact, tongue/uvula/soft palate midline, normal sternocleidomastoid and trapezius muscle strength. No evidence of tongue atrophy or fibrillations Motor: 5/5 without drift in all fours Tone: is normal and bulk is  normal Sensation-different/feeling tingly on the left hemibody in comparison to the left Coordination: FTN intact bilaterally, no ataxia in BLE. Gait- deferred  NIHSS-1 for sensory  Labs I have reviewed labs in epic and the results pertinent to this consultation are:  CBC    Component Value Date/Time   WBC 7.8 09/28/2021 1343   RBC 4.40 09/28/2021 1343   HGB 14.4 09/28/2021 1343   HCT 42.3 09/28/2021 1343   PLT 238 09/28/2021 1343   MCV 96.1 09/28/2021 1343   MCH 32.7 09/28/2021 1343   MCHC 34.0 09/28/2021 1343   RDW 11.8 09/28/2021 1343   LYMPHSABS 1,435 09/28/2021 1343   MONOABS 0.5 06/02/2020 0929   EOSABS 70 09/28/2021 1343   BASOSABS 39 09/28/2021 1343    CMP     Component Value Date/Time   NA 142 09/28/2021 1343   K 4.2 09/28/2021 1343   CL 107 09/28/2021 1343   CO2 23 09/28/2021 1343   GLUCOSE 88 09/28/2021 1343   BUN 20 09/28/2021 1343   CREATININE 0.79 09/28/2021 1343   CALCIUM 9.9 09/28/2021 1343   PROT 6.8 09/28/2021 1343   ALBUMIN 4.3 06/02/2020 0929   AST 21 09/28/2021 1343   ALT 14 09/28/2021 1343   ALKPHOS 117 06/02/2020 0929   BILITOT 0.6 09/28/2021 1343   GFRNONAA 71 04/18/2016 0711   GFRAA 81 04/18/2016 0711   Lipid Panel     Component Value Date/Time   CHOL 129 09/28/2021 1343   TRIG 80 09/28/2021 1343   HDL 49 (L) 09/28/2021 1343   CHOLHDL 2.6 09/28/2021 1343   VLDL 22.8 06/02/2020 0929   LDLCALC 64 09/28/2021 1343   LDLDIRECT 138.8 10/16/2012 0923   Imaging I have reviewed the images obtained:  CT-head-aspects 10.  No acute process CT angio head and neck with no emergent LVO.  No evidence of dissection.  Assessment: 67 year old with sudden onset of left-sided facial tingling numbness along with left chest and arm tingling and numbness with concern for strokelike features. On my examination, she only had sensory deficits on the left hemibody in comparison to the right and also acknowledge some headache. She has had migraines in  the past which have responded well to Topamax. Given her age now, along with risk factors, I think we should do a stat MRI to make sure this is not an acute stroke. Not a candidate for IV thrombolysis due to symptoms too mild to treat  Impression: Most likely complex migraine Rule out stroke  Recommendations: Stat MRI of the brain  Addendum MRI of the brain was completed and personally reviewed-no evidence of acute stroke. Treat with migraine cocktail If symptoms improve, can follow-up outpatient. If they do not improve, please call us back for further evaluation and input. Plan was  discussed with Dr. Doren Custard over secure chat.   -- Amie Portland, MD Neurologist [facility] Pager: [phone]

## 2022-06-11 ENCOUNTER — Telehealth: Payer: Self-pay

## 2022-06-11 NOTE — Telephone Encounter (Signed)
Called patient to complete TOC call.  Patient states she has moved her care to [facility].

## 2022-06-13 ENCOUNTER — Ambulatory Visit: Payer: No Typology Code available for payment source | Attending: Cardiology | Admitting: Cardiology

## 2022-06-13 ENCOUNTER — Encounter: Payer: Self-pay | Admitting: Cardiology

## 2022-06-13 VITALS — BP 160/86 | HR 64 | Ht 64.0 in | Wt 198.0 lb

## 2022-06-13 DIAGNOSIS — F411 Generalized anxiety disorder: Secondary | ICD-10-CM

## 2022-06-13 DIAGNOSIS — R0789 Other chest pain: Secondary | ICD-10-CM | POA: Insufficient documentation

## 2022-06-13 DIAGNOSIS — K219 Gastro-esophageal reflux disease without esophagitis: Secondary | ICD-10-CM

## 2022-06-13 DIAGNOSIS — E782 Mixed hyperlipidemia: Secondary | ICD-10-CM

## 2022-06-13 DIAGNOSIS — R079 Chest pain, unspecified: Secondary | ICD-10-CM

## 2022-06-13 MED ORDER — METOPROLOL TARTRATE 50 MG PO TABS: ORAL_TABLET | ORAL | 0 refills | Status: DC

## 2022-06-13 NOTE — Progress Notes (Signed)
This visit was accompanied by Truddie Hidden, RN.

## 2022-06-13 NOTE — Progress Notes (Signed)
Cardiology Consultation:    Date:  06/13/2022   ID:  Kelli Gould, DOB 09/05/1955, MRN [id number]  PCP:  Wenda Low, MD  Cardiologist:  Jenne Campus, MD   Referring MD: No ref. provider found   No chief complaint on file. I had a chest pain  History of Present Illness:    MARGEAUX Gould is a 67 y.o. female who is being seen today for the evaluation of chest pain at the request of No ref. provider found.  Past medical history significant for essential hyper tension, dyslipidemia, anxiety.  She was referred to [place] because few weeks ago he was sitting and started having strength sensation of the left side of her chest and body that involve also tingling in her left arm.  There was some tingling and strength sensation of the left side of her face.  She also complained of having some chest sensation she had difficulty describing it.  There was maybe a little shortness of breath associated with this sensation however there was no sweating.  Eventually she ended up going to the emergency room workup in the emergency room for CVA was negative, evaluation of her cerebral circulation was negative for any significant stenosis. Her risk factors for coronary artery disease include essential hypertension, dyslipidemia which is treated with Crestor, she does have family history of premature coronary disease, she does not smoke never did.  She is trying to be active but admits she does not do much in terms of exercises.  Past Medical History:  Diagnosis Date   Anxiety    Family history of breast cancer    Family history of ovarian cancer     Past Surgical History:  Procedure Laterality Date   APPENDECTOMY  1995   BACK SURGERY     x's 3 days--Fusion on 2 diferent disk   CHOLECYSTECTOMY  1994   SPINE SURGERY  2004,05,09    microdiscectomy, fusion  L3-4, L5-6-- Dr Tonita Cong   VAGINAL HYSTERECTOMY  1994   TAH-- endometriosis    Current Medications: Current Meds  Medication Sig    FLUoxetine (PROZAC) 20 MG tablet Take 1 tablet (20 mg total) by mouth daily.   metoprolol tartrate (LOPRESSOR) 50 MG tablet Take one tablet 2 hours before cardiac CT for heart greater than 55   rosuvastatin (CRESTOR) 10 MG tablet Take 1 tablet (10 mg total) by mouth daily.     Allergies:   Patient has no known allergies.   Social History   Socioeconomic History   Marital status: Married    Spouse name: Not on file   Number of children: 2   Years of education: Not on file   Highest education level: Not on file  Occupational History   Not on file  Tobacco Use   Smoking status: Never   Smokeless tobacco: Never  Vaping Use   Vaping Use: Never used  Substance and Sexual Activity   Alcohol use: No   Drug use: No   Sexual activity: Yes    Partners: Male  Other Topics Concern   Not on file  Social History Narrative   Exercise--walk---not enough per pt   Lives with husband in a one story home.  Has 2 children.  Works for [employer].     Social Determinants of Health   Financial Resource Strain: Not on file  Food Insecurity: Not on file  Transportation Needs: Not on file  Physical Activity: Not on file  Stress: Not on file  Social Connections:  Not on file     Family History: The patient's family history includes Breast cancer in some other family members; COPD in her father; Congenital heart disease in her father; Crohn's disease in her father; Dementia in her mother; Heart disease in her maternal aunt and maternal grandfather; Heart disease (age of onset: 47) in her mother; Hypertension (age of onset: 61) in her mother; Lung cancer in an other family member; Ovarian cancer (age of onset: 77) in her maternal grandmother; Skin cancer in an other family member. ROS:   Please see the history of present illness.    All 14 point review of systems negative except as described per history of present illness.  EKGs/Labs/Other Studies Reviewed:    The following studies were reviewed  today: CTs MRI of the brain reviewed  EKG:  EKG is  ordered today.  The ekg ordered today demonstrates normal sinus rhythm normal P interval low voltage QRS nonspecific ST segment changes  Recent Labs: 09/28/2021: TSH 1.05 06/10/2022: ALT 20; BUN 20; Creatinine, Ser 0.70; Hemoglobin 14.6; Platelets 230; Potassium 4.7; Sodium 141  Recent Lipid Panel    Component Value Date/Time   CHOL 129 09/28/2021 1343   TRIG 80 09/28/2021 1343   HDL 49 (L) 09/28/2021 1343   CHOLHDL 2.6 09/28/2021 1343   VLDL 22.8 06/02/2020 0929   LDLCALC 64 09/28/2021 1343   LDLDIRECT 138.8 10/16/2012 0923    Physical Exam:    VS:  BP (!) 160/86 (BP Location: Right Arm, Patient Position: Sitting, Cuff Size: Normal)   Pulse 64   Ht 5\' 4"  (1.626 m)   Wt 198 lb (89.8 kg)   SpO2 98%   BMI 33.99 kg/m     Wt Readings from Last 3 Encounters:  06/13/22 198 lb (89.8 kg)  06/10/22 193 lb (87.5 kg)  09/28/21 219 lb 3.2 oz (99.4 kg)     GEN:  Well nourished, well developed in no acute distress HEENT: Normal NECK: No JVD; No carotid bruits LYMPHATICS: No lymphadenopathy CARDIAC: RRR, no murmurs, no rubs, no gallops RESPIRATORY:  Clear to auscultation without rales, wheezing or rhonchi  ABDOMEN: Soft, non-tender, non-distended MUSCULOSKELETAL:  No edema; No deformity  SKIN: Warm and dry NEUROLOGIC:  Alert and oriented x 3 PSYCHIATRIC:  Normal affect   ASSESSMENT:    1. Chest pain of uncertain etiology   2. Atypical chest pain   3. Gastroesophageal reflux disease without esophagitis   4. Generalized anxiety disorder   5. Mixed hyperlipidemia    PLAN:    In order of problems listed above:  Atypical chest pain after admit very atypical however she does have some risk factors for coronary artery disease we had a 1 long discussion about what to do with the situation and I think it would be reasonable to perform coronary CT angio heart to see if she get any coronary artery disease.  I explained procedure to  her including all risk benefits as well as alternatives.  She is willing to proceed Essential hypertension blood pressure elevated today but she is very nervous and anxious she tells me that her blood pressure at home is usually 1 57-8 40 systolic.  Will continue monitoring. Dyslipidemia I reviewed her K PN which show me her LDL 64 HDL 49 is a good cholesterol profile continue with Crestor 10.   Medication Adjustments/Labs and Tests Ordered: Current medicines are reviewed at length with the patient today.  Concerns regarding medicines are outlined above.  Orders Placed This Encounter  Procedures   CT CORONARY MORPH W/CTA COR W/SCORE W/CA W/CM &/OR WO/CM   EKG 12-Lead   Meds ordered this encounter  Medications   metoprolol tartrate (LOPRESSOR) 50 MG tablet    Sig: Take one tablet 2 hours before cardiac CT for heart greater than 55    Dispense:  1 tablet    Refill:  0    Signed, Park Liter, MD, [facility]. 06/13/2022 10:43 AM    Joseph

## 2022-06-13 NOTE — Patient Instructions (Addendum)
Medication Instructions:   Take Metoprolol 50mg  1 tablet 2 hours prior to CT Scan  *If you need a refill on your cardiac medications before your next appointment, please call your pharmacy*   Lab Work: None If you have labs (blood work) drawn today and your tests are completely normal, you will receive your results only by: [place] (if you have MyChart) OR A paper copy in the mail If you have any lab test that is abnormal or we need to change your treatment, we will call you to review the results.   Testing/Procedures: Your physician has requested that you have cardiac CT. Cardiac computed tomography (CT) is a painless test that uses an x-ray machine to take clear, detailed pictures of your heart. For further information please visit [email or web address]. Please follow instruction sheet as given.    Your Cardiac CT will be scheduled at:   [facility] located off [facility] at the hospital.  Please arrive 30 minutes prior to your appointment time.  You can use the FREE valet parking offered at entrance to outpatient center (encouraged to control the heart rate for the test)   Please follow these instructions carefully (unless otherwise directed):    On the Night Before the Test: Be sure to Drink plenty of water. Do not consume any caffeinated/decaffeinated beverages or chocolate 12 hours prior to your test.  On the Day of the Test: Drink plenty of water until 1 hour prior to the test. Do not eat any food 4 hours prior to the test. No smoking 4 hours prior to test. You may take your regular medications prior to the test.  Take metoprolol (Lopressor) two hours prior to test. FEMALES- please wear underwire-free bra if available, avoid dresses & tight clothing. Wear plain shirt no beads, sparkles, rhinestones, metal or heavy embroidery.  After the Test: Drink plenty of water. After receiving IV contrast, you may experience a mild flushed  feeling. This is normal. On occasion, you may experience a mild rash up to 24 hours after the test. This is not dangerous. If this occurs, you can take Benadryl 25 mg and increase your fluid intake. If you experience trouble breathing, this can be serious. If it is severe call 911 IMMEDIATELY. If it is mild, please call our office. If you take any of these medications: Glipizide/Metformin, Avandament, Glucavance, please do not take 48 hours after completing test unless otherwise instructed.  We will call to schedule your test 2-4 weeks out understanding that some insurance companies will need an authorization prior to the service being performed.      Follow-Up: At [facility], you and your health needs are our priority.  As part of our continuing mission to provide you with exceptional heart care, we have created designated Provider Care Teams.  These Care Teams include your primary Cardiologist (physician) and Advanced Practice Providers (APPs -  Physician Assistants and Nurse Practitioners) who all work together to provide you with the care you need, when you need it.  We recommend signing up for the patient portal called "MyChart".  Sign up information is provided on this After Visit Summary.  MyChart is used to connect with patients for Virtual Visits (Telemedicine).  Patients are able to view lab/test results, encounter notes, upcoming appointments, etc.  Non-urgent messages can be sent to your provider as well.   To learn more about what you can do with MyChart, go to [email or web address].    Your next appointment:  2 month(s)  The format for your next appointment:   In Person  Provider:   Jenne Campus, MD    Other Instructions Cardiac CT Angiogram A cardiac CT angiogram is a procedure to look at the heart and the area around the heart. It may be done to help find the cause of chest pains or other symptoms of heart disease. During this procedure, a substance called  contrast dye is injected into the blood vessels in the area to be checked. A large X-ray machine, called a CT scanner, then takes detailed pictures of the heart and the surrounding area. The procedure is also sometimes called a coronary CT angiogram, coronary artery scanning, or CTA. A cardiac CT angiogram allows the health care provider to see how well blood is flowing to and from the heart. The health care provider will be able to see if there are any problems, such as: Blockage or narrowing of the coronary arteries in the heart. Fluid around the heart. Signs of weakness or disease in the muscles, valves, and tissues of the heart. Tell a health care provider about: Any allergies you have. This is especially important if you have had a previous allergic reaction to contrast dye. All medicines you are taking, including vitamins, herbs, eye drops, creams, and over-the-counter medicines. Any blood disorders you have. Any surgeries you have had. Any medical conditions you have. Whether you are pregnant or may be pregnant. Any anxiety disorders, chronic pain, or other conditions you have that may increase your stress or prevent you from lying still. What are the risks? Generally, this is a safe procedure. However, problems may occur, including: Bleeding. Infection. Allergic reactions to medicines or dyes. Damage to other structures or organs. Kidney damage from the contrast dye that is used. Increased risk of cancer from radiation exposure. This risk is low. Talk with your health care provider about: The risks and benefits of testing. How you can receive the lowest dose of radiation. What happens before the procedure? Wear comfortable clothing and remove any jewelry, glasses, dentures, and hearing aids. Follow instructions from your health care provider about eating and drinking. This may include: For 12 hours before the procedure -- avoid caffeine. This includes tea, coffee, soda, energy  drinks, and diet pills. Drink plenty of water or other fluids that do not have caffeine in them. Being well hydrated can prevent complications. For 4-6 hours before the procedure -- stop eating and drinking. The contrast dye can cause nausea, but this is less likely if your stomach is empty. Ask your health care provider about changing or stopping your regular medicines. This is especially important if you are taking diabetes medicines, blood thinners, or medicines to treat problems with erections (erectile dysfunction). What happens during the procedure?  Hair on your chest may need to be removed so that small sticky patches called electrodes can be placed on your chest. These will transmit information that helps to monitor your heart during the procedure. An IV will be inserted into one of your veins. You might be given a medicine to control your heart rate during the procedure. This will help to ensure that good images are obtained. You will be asked to lie on an exam table. This table will slide in and out of the CT machine during the procedure. Contrast dye will be injected into the IV. You might feel warm, or you may get a metallic taste in your mouth. You will be given a medicine called nitroglycerin. This will  relax or dilate the arteries in your heart. The table that you are lying on will move into the CT machine tunnel for the scan. The person running the machine will give you instructions while the scans are being done. You may be asked to: Keep your arms above your head. Hold your breath. Stay very still, even if the table is moving. When the scanning is complete, you will be moved out of the machine. The IV will be removed. The procedure may vary among health care providers and hospitals. What can I expect after the procedure? After your procedure, it is common to have: A metallic taste in your mouth from the contrast dye. A feeling of warmth. A headache from the  nitroglycerin. Follow these instructions at home: Take over-the-counter and prescription medicines only as told by your health care provider. If you are told, drink enough fluid to keep your urine pale yellow. This will help to flush the contrast dye out of your body. Most people can return to their normal activities right after the procedure. Ask your health care provider what activities are safe for you. It is up to you to get the results of your procedure. Ask your health care provider, or the department that is doing the procedure, when your results will be ready. Keep all follow-up visits as told by your health care provider. This is important. Contact a health care provider if: You have any symptoms of allergy to the contrast dye. These include: Shortness of breath. Rash or hives. A racing heartbeat. Summary A cardiac CT angiogram is a procedure to look at the heart and the area around the heart. It may be done to help find the cause of chest pains or other symptoms of heart disease. During this procedure, a large X-ray machine, called a CT scanner, takes detailed pictures of the heart and the surrounding area after a contrast dye has been injected into blood vessels in the area. Ask your health care provider about changing or stopping your regular medicines before the procedure. This is especially important if you are taking diabetes medicines, blood thinners, or medicines to treat erectile dysfunction. If you are told, drink enough fluid to keep your urine pale yellow. This will help to flush the contrast dye out of your body. This information is not intended to replace advice given to you by your health care provider. Make sure you discuss any questions you have with your health care provider. Document Revised: 08/09/2021 Document Reviewed: 12/16/2018 Elsevier Patient Education  [place].  This visit was accompanied by Evans Lance.  Important Information About  [place]

## 2022-06-17 ENCOUNTER — Telehealth: Payer: Self-pay | Admitting: Cardiology

## 2022-06-17 NOTE — Telephone Encounter (Signed)
Mendel Ryder with [employer] calling to confirm whether a prior authorization has been sent to them and if not whether one would be sent. She says her direct line is: [phone] and has a Medical illustrator.

## 2022-06-20 NOTE — Telephone Encounter (Signed)
Ria Comment is following up due to not hearing back from anyone regarding whether or not a prior authorization has been sent to them. She states she has not received anything. Please return call to Ria Comment to discuss at [phone] and leave a voice message on her confidential voicemail if she is unavailable.

## 2022-06-20 NOTE — Telephone Encounter (Signed)
Resent message to precert for CT angio

## 2022-07-01 ENCOUNTER — Encounter: Payer: Self-pay | Admitting: Cardiology

## 2022-07-01 ENCOUNTER — Telehealth: Payer: Self-pay

## 2022-07-01 NOTE — Telephone Encounter (Signed)
Spoke with pt and advised that the CT Scan is back but Dr. Agustin Cree has not made comments. Reassured pt that per Impression that there is nothing Urgent noted that she should worry about. Dr. Agustin Cree will make comments and recommendations and we will call her with the results. Pt agreed and verbalized understanding.

## 2022-07-03 ENCOUNTER — Telehealth: Payer: Self-pay

## 2022-07-03 NOTE — Telephone Encounter (Signed)
LVM and My Chart message per DPR- Per Dr. Wendy Poet note. Encouraged pt call with any questions. Routed to PCP.

## 2022-08-20 ENCOUNTER — Ambulatory Visit: Payer: No Typology Code available for payment source | Attending: Cardiology | Admitting: Cardiology

## 2022-08-20 ENCOUNTER — Encounter: Payer: Self-pay | Admitting: Cardiology

## 2022-08-20 VITALS — BP 150/74 | HR 76 | Ht 64.5 in | Wt 201.0 lb

## 2022-08-20 DIAGNOSIS — R0789 Other chest pain: Secondary | ICD-10-CM

## 2022-08-20 DIAGNOSIS — E782 Mixed hyperlipidemia: Secondary | ICD-10-CM

## 2022-08-20 DIAGNOSIS — I2510 Atherosclerotic heart disease of native coronary artery without angina pectoris: Secondary | ICD-10-CM

## 2022-08-20 DIAGNOSIS — I10 Essential (primary) hypertension: Secondary | ICD-10-CM

## 2022-08-20 MED ORDER — ASPIRIN 81 MG PO TBEC: 81.0000 mg | DELAYED_RELEASE_TABLET | Freq: Every day | ORAL | 3 refills | Status: AC

## 2022-08-20 NOTE — Patient Instructions (Signed)
Medication Instructions:   START: Aspirin  1 tablet daily   Lab Work: None Ordered If you have labs (blood work) drawn today and your tests are completely normal, you will receive your results only by: MyChart Message (if you have MyChart) OR A paper copy in the mail If you have any lab test that is abnormal or we need to change your treatment, we will call you to review the results.   Testing/Procedures: None Ordered   Follow-Up: At [facility], you and your health needs are our priority.  As part of our continuing mission to provide you with exceptional heart care, we have created designated Provider Care Teams.  These Care Teams include your primary Cardiologist (physician) and Advanced Practice Providers (APPs -  Physician Assistants and Nurse Practitioners) who all work together to provide you with the care you need, when you need it.  We recommend signing up for the patient portal called "MyChart".  Sign up information is provided on this After Visit Summary.  MyChart is used to connect with patients for Virtual Visits (Telemedicine).  Patients are able to view lab/test results, encounter notes, upcoming appointments, etc.  Non-urgent messages can be sent to your provider as well.   To learn more about what you can do with MyChart, go to [email or web address].    Your next appointment:   12 month(s)  The format for your next appointment:   In Person  Provider:   Gypsy Balsam, MD    Other Instructions NA

## 2022-08-20 NOTE — Progress Notes (Signed)
Cardiology Office Note:    Date:  08/20/2022   ID:  Kelli Gould, DOB Jul 10, 1955, MRN [id number]  PCP:  Georgann Housekeeper, MD  Cardiologist:  Gypsy Balsam, MD    Referring MD: Georgann Housekeeper, MD   Chief Complaint  Patient presents with   Follow-up    History of Present Illness:    Kelli Gould is a 67 y.o. female with past medical history significant for dyslipidemia, anxiety, essential hypertension, she was referred to [place] because of atypical chest pain, coronary CT angio has been performed which showed only minimal disease in the LAD with 0 to 25% stenosis.  She is very happy with the results of it.  Surprisingly her lung parenchyma showed either atelectasis or infiltrates.  She has no signs and symptoms of infection overall she is doing very well.  She denies have any chest pain tightness squeezing pressure burning chest.  She is thinking about retiring and hope being that she will be able to do more exercises.  Past Medical History:  Diagnosis Date   Anxiety    Family history of breast cancer    Family history of ovarian cancer     Past Surgical History:  Procedure Laterality Date   APPENDECTOMY  1995   BACK SURGERY     x's 3 days--Fusion on 2 diferent disk   CHOLECYSTECTOMY  1994   SPINE SURGERY  2004,05,09    microdiscectomy, fusion  L3-4, L5-6-- Dr Shelle Iron   VAGINAL HYSTERECTOMY  1994   TAH-- endometriosis    Current Medications: Current Meds  Medication Sig   FLUoxetine (PROZAC) 20 MG tablet Take 1 tablet (20 mg total) by mouth daily.   rosuvastatin (CRESTOR) 10 MG tablet Take 1 tablet (10 mg total) by mouth daily.   [DISCONTINUED] metoprolol tartrate (LOPRESSOR) 50 MG tablet Take one tablet 2 hours before cardiac CT for heart greater than 55     Allergies:   Patient has no known allergies.   Social History   Socioeconomic History   Marital status: Married    Spouse name: Not on file   Number of children: 2   Years of education: Not on file    Highest education level: Not on file  Occupational History   Not on file  Tobacco Use   Smoking status: Never   Smokeless tobacco: Never  Vaping Use   Vaping Use: Never used  Substance and Sexual Activity   Alcohol use: No   Drug use: No   Sexual activity: Yes    Partners: Male  Other Topics Concern   Not on file  Social History Narrative   Exercise--walk---not enough per pt   Lives with husband in a one story home.  Has 2 children.  Works for [employer].     Social Determinants of Corporate investment banker Strain: Not on file  Food Insecurity: Not on file  Transportation Needs: Not on file  Physical Activity: Not on file  Stress: Not on file  Social Connections: Not on file     Family History: The patient's family history includes Breast cancer in some other family members; COPD in her father; Congenital heart disease in her father; Crohn's disease in her father; Dementia in her mother; Heart disease in her maternal aunt and maternal grandfather; Heart disease (age of onset: 62) in her mother; Hypertension (age of onset: 77) in her mother; Lung cancer in an other family member; Ovarian cancer (age of onset: 70) in her maternal grandmother; Skin cancer  in an other family member. ROS:   Please see the history of present illness.    All 14 point review of systems negative except as described per history of present illness  EKGs/Labs/Other Studies Reviewed:      Recent Labs: 09/28/2021: TSH 1.05 06/10/2022: ALT 20; BUN 20; Creatinine, Ser 0.70; Hemoglobin 14.6; Platelets 230; Potassium 4.7; Sodium 141  Recent Lipid Panel    Component Value Date/Time   CHOL 129 09/28/2021 1343   TRIG 80 09/28/2021 1343   HDL 49 (L) 09/28/2021 1343   CHOLHDL 2.6 09/28/2021 1343   VLDL 22.8 06/02/2020 0929   LDLCALC 64 09/28/2021 1343   LDLDIRECT 138.8 10/16/2012 0923    Physical Exam:    VS:  BP (!) 150/74 (BP Location: Left Arm, Patient Position: Sitting)   Pulse 76   Ht 5' 4.5" (1.638  m)   Wt 201 lb (91.2 kg)   SpO2 96%   BMI 33.97 kg/m     Wt Readings from Last 3 Encounters:  08/20/22 201 lb (91.2 kg)  06/13/22 198 lb (89.8 kg)  06/10/22 193 lb (87.5 kg)     GEN:  Well nourished, well developed in no acute distress HEENT: Normal NECK: No JVD; No carotid bruits LYMPHATICS: No lymphadenopathy CARDIAC: RRR, no murmurs, no rubs, no gallops RESPIRATORY:  Clear to auscultation without rales, wheezing or rhonchi  ABDOMEN: Soft, non-tender, non-distended MUSCULOSKELETAL:  No edema; No deformity  SKIN: Warm and dry LOWER EXTREMITIES: no swelling NEUROLOGIC:  Alert and oriented x 3 PSYCHIATRIC:  Normal affect   ASSESSMENT:    1. Coronary artery disease involving native coronary artery of native heart without angina pectoris   2. Atypical chest pain   3. Mixed hyperlipidemia   4. Essential hypertension    PLAN:    In order of problems listed above:  Coronary disease I did review her CT with her.  Only minimal disease had told her she does have atherosclerosis but likely is nonobstructive no need to do cardiac catheterization, decrease risk factors modifications, I asked her to start taking 1 baby aspirin every single day. Dyslipidemia I did review her K PN which show me her LDL 64 HDL 49 this is from Sep 28, 2021 she takes 10 mg of Crestor which I will continue.  She does have appoint with her primary care physician within the next week or 2 and she will have fasting lipid profile done.  Will wait for results of the test. Essential hypertension seems to be uncontrolled, she does not want to go on any medication at this stage.  She promised me to check her blood pressure on the regular basis and bring results to me within about 2 weeks or so. We did talk about healthy lifestyle need to exercise on the regular basis hopefully she will join her husband and exercises.   Medication Adjustments/Labs and Tests Ordered: Current medicines are reviewed at length with the  patient today.  Concerns regarding medicines are outlined above.  No orders of the defined types were placed in this encounter.  Medication changes: No orders of the defined types were placed in this encounter.   Signed, Georgeanna Lea, MD, [facility] 08/20/2022 8:23 AM    [facility]

## 2022-10-30 ENCOUNTER — Other Ambulatory Visit: Payer: Self-pay | Admitting: Internal Medicine

## 2022-10-30 DIAGNOSIS — Z1231 Encounter for screening mammogram for malignant neoplasm of breast: Secondary | ICD-10-CM

## 2022-11-05 ENCOUNTER — Ambulatory Visit
Admission: RE | Admit: 2022-11-05 | Discharge: 2022-11-05 | Disposition: A | Discharge status: Home / Self Care | Payer: No Typology Code available for payment source | Source: Ambulatory Visit | Attending: Internal Medicine | Admitting: Internal Medicine

## 2022-11-05 DIAGNOSIS — Z1231 Encounter for screening mammogram for malignant neoplasm of breast: Secondary | ICD-10-CM
